# Patient Record
Sex: Female | Born: 1980 | Race: White | Hispanic: No | Marital: Married | Smoking: Former smoker
Health system: Southern US, Community
[De-identification: ages and names within clinical notes are randomized; demographics above are authoritative.]

## PROBLEM LIST (undated history)

## (undated) DIAGNOSIS — Z9889 Other specified postprocedural states: Secondary | ICD-10-CM

## (undated) DIAGNOSIS — K219 Gastro-esophageal reflux disease without esophagitis: Secondary | ICD-10-CM

## (undated) DIAGNOSIS — F419 Anxiety disorder, unspecified: Secondary | ICD-10-CM

## (undated) DIAGNOSIS — Z87442 Personal history of urinary calculi: Secondary | ICD-10-CM

## (undated) DIAGNOSIS — N289 Disorder of kidney and ureter, unspecified: Secondary | ICD-10-CM

## (undated) DIAGNOSIS — R112 Nausea with vomiting, unspecified: Secondary | ICD-10-CM

## (undated) DIAGNOSIS — O24419 Gestational diabetes mellitus in pregnancy, unspecified control: Secondary | ICD-10-CM

## (undated) HISTORY — PX: WISDOM TOOTH EXTRACTION: SHX21

## (undated) HISTORY — PX: DILATION AND CURETTAGE OF UTERUS: SHX78

## (undated) HISTORY — PX: LITHOTRIPSY: SUR834

---

## 2008-04-11 HISTORY — PX: DILATION AND CURETTAGE OF UTERUS: SHX78

## 2016-06-11 DIAGNOSIS — N760 Acute vaginitis: Secondary | ICD-10-CM | POA: Diagnosis not present

## 2016-12-07 DIAGNOSIS — M9901 Segmental and somatic dysfunction of cervical region: Secondary | ICD-10-CM | POA: Diagnosis not present

## 2016-12-07 DIAGNOSIS — M9902 Segmental and somatic dysfunction of thoracic region: Secondary | ICD-10-CM | POA: Diagnosis not present

## 2016-12-07 DIAGNOSIS — M542 Cervicalgia: Secondary | ICD-10-CM | POA: Diagnosis not present

## 2016-12-15 ENCOUNTER — Encounter (HOSPITAL_COMMUNITY): Payer: Self-pay | Admitting: Family Medicine

## 2016-12-15 ENCOUNTER — Emergency Department (HOSPITAL_COMMUNITY)
Admission: EM | Admit: 2016-12-15 | Discharge: 2016-12-15 | Disposition: A | Discharge status: Home / Self Care | Payer: 59 | Attending: Emergency Medicine | Admitting: Emergency Medicine

## 2016-12-15 DIAGNOSIS — F419 Anxiety disorder, unspecified: Secondary | ICD-10-CM | POA: Diagnosis not present

## 2016-12-15 DIAGNOSIS — F410 Panic disorder [episodic paroxysmal anxiety] without agoraphobia: Secondary | ICD-10-CM | POA: Diagnosis present

## 2016-12-15 DIAGNOSIS — Z87891 Personal history of nicotine dependence: Secondary | ICD-10-CM | POA: Insufficient documentation

## 2016-12-15 HISTORY — DX: Anxiety disorder, unspecified: F41.9

## 2016-12-15 HISTORY — DX: Disorder of kidney and ureter, unspecified: N28.9

## 2016-12-15 MED ORDER — HYDROXYZINE HCL 25 MG PO TABS: 50.0000 mg | ORAL_TABLET | Freq: Four times a day (QID) | ORAL | 0 refills | Status: AC | PRN

## 2016-12-15 NOTE — Discharge Instructions (Signed)
Please see the information and instructions below regarding your visit.  Your diagnoses today include:  1. Anxiety-like symptoms    We would like to get [facility]  Tests performed today include: See side panel of your discharge paperwork for testing performed today. Vital signs are listed at the bottom of these instructions.   Medications prescribed:   Your prescribed a medication called hydroxyzine. This medication helps with anxiety and can be taken up to 4 times a day. This medication can make you drowsy, so do not drive after first couple doses until you know how you feel. Take any prescribed medications only as prescribed, and any over the counter medications only as directed on the packaging.  Home care instructions:  Please follow any educational materials contained in this packet.   Follow-up instructions: Please follow-up with as soon as possible with your counselor or Behavioral Health listed in this packet for further assistance. The can also help get you back on your Cymbalta prescription.  Return instructions:  Please return for any worsening in your anxiety, frequency attacks, or any thoughts that you wanted to harm herself. Please return to the Emergency Department if you experience worsening symptoms.  Please return if you have any other emergent concerns.  Your vital signs today were: BP 140/81    Pulse 90    Temp 98.4 F (36.9 C)    Resp 15    Ht 5\' 2"  (1.575 m)    Wt 64.4 kg (142 lb)    LMP 12/05/2016    SpO2 99%    BMI 25.97 kg/m  If your blood pressure (BP) was elevated on multiple readings during this visit above 130 for the top number or above 80 for the bottom number, please have this repeated by your primary care provider within one month. --------------  Thank you for allowing us to participate in your care today.

## 2016-12-15 NOTE — ED Notes (Signed)
Bed: WTR5 Expected date:  Expected time:  Means of arrival:  Comments: 

## 2016-12-15 NOTE — ED Provider Notes (Signed)
[facility]-EMERGENCY DEPT Provider Note   CSN: [id number] Arrival date & time: 12/15/16  0550     History   Chief Complaint Chief Complaint  Patient presents with  . Panic Attack    HPI Felicia Tucker is a 36 y.o. female.  HPI  Patient is a 36 year old female with a history of anxiety and depression presenting for acute stress and panic symptoms following revealing to her spouse that she is having an affair. Patient reports that multiple times during this past week she has had symptoms of sudden dyspnea, lightheadedness, nausea and occasional emesis while thinking about revealing this information to her husband. Patient reports wishing to receive further resources in Behavioral Health to assist her through this situation. 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 Patient has had no physical threats from her spouse or others. Patient reports that she has a safe place to go at her brother's home. Patient denies any SI/HI, or any history of suicidal ideation or attempt. Patient had an ongoing relationship with a counselor, who she has not seen in a couple months. Patient was previously treated with Cymbalta, which she says works for her. Patient reports headaches, which she says are consistent with her baseline. Patient denies chest pain, dyspnea with exertion, abdominal pain, unilateral leg swelling, or other symptoms of DVT/PE. Patient has no cardiac or pulmonary history, and takes no other medications.    Past Medical History:  Diagnosis Date  . Anxiety   . Renal disorder    Kidney Stones    There are no active problems to display for this patient.   Past Surgical History:  Procedure Laterality Date  . DILATION AND CURETTAGE OF UTERUS    . LITHOTRIPSY    . WISDOM TOOTH EXTRACTION      OB History    No data available       Home Medications    Prior to  Admission medications   Medication Sig Start Date End Date Taking? Authorizing Provider  aspirin-acetaminophen-caffeine (EXCEDRIN MIGRAINE) [phone] MG tablet Take 2 tablets by mouth every 6 (six) hours as needed for headache.   Yes [provider]  ibuprofen (ADVIL,MOTRIN) 200 MG tablet Take 400 mg by mouth every 6 (six) hours as needed for headache.   Yes [provider]  hydrOXYzine (ATARAX/VISTARIL) 25 MG tablet Take 2 tablets (50 mg total) by mouth every 6 (six) hours as needed for anxiety. Take 1-2 pills up to 4 times daily. Take only 1 pill if you are getting too drowsy. 12/15/16 12/22/16  Elisha Ponder, PA-C    Family History History reviewed. No pertinent family history.  Social History Social History  Substance Use Topics  . Smoking status: Former Games developer  . Smokeless tobacco: Never Used  . Alcohol use No     Allergies   Patient has no known allergies.   Review of Systems Review of Systems  Constitutional: Negative for chills and fever.  HENT: Negative for congestion and rhinorrhea.   Eyes: Negative for visual disturbance.  Respiratory: Negative for shortness of breath.   Cardiovascular: Positive for palpitations. Negative for chest pain and leg swelling.  Gastrointestinal: Positive for nausea and vomiting. Negative for abdominal pain and diarrhea.  Genitourinary: Negative for dysuria.  Musculoskeletal: Negative for arthralgias and myalgias.  Neurological: Positive for light-headedness and headaches. Negative for dizziness and syncope.  Psychiatric/Behavioral: Negative for suicidal ideas.  Physical Exam Updated Vital Signs BP 136/80 (BP Location: Right Arm)   Pulse 89   Temp 98.4 F (36.9 C)   Resp 18   Ht  (1.575 m)   Wt 64.4 kg (142 lb)   LMP 12/05/2016   SpO2 100%   BMI 25.97 kg/m   Physical Exam  Constitutional: She appears well-developed and well-nourished. No distress.  Sitting comfortably in bed.  HENT:  Head:  Normocephalic and atraumatic.  Eyes: Conjunctivae are normal. Right eye exhibits no discharge. Left eye exhibits no discharge.  EOMs normal to gross examination.  Neck: Normal range of motion.  Cardiovascular: Normal rate, regular rhythm and normal heart sounds.   Intact, 2+ radial pulse.  Pulmonary/Chest: Effort normal and breath sounds normal.  Abdominal: She exhibits no distension.  Musculoskeletal: Normal range of motion.  Neurological: She is alert.  Cranial nerves intact to gross observation. Patient moves extremities with good coordination and without difficulty.  Skin: Skin is warm and dry. She is not diaphoretic.  Psychiatric: She has a normal mood and affect. Her behavior is normal. Judgment and thought content normal.  Patient tearful and mood appropriate to situation.  Nursing note and vitals reviewed.    ED Treatments / Results  Labs (all labs ordered are listed, but only abnormal results are displayed) Labs Reviewed - No data to display  EKG  EKG Interpretation None       Radiology No results found.  Procedures Procedures (including critical care time)  Medications Ordered in ED Medications - No data to display   Initial Impression / Assessment and Plan / ED Course  I have reviewed the triage vital signs and the nursing notes.  Pertinent labs & imaging results that were available during my care of the patient were reviewed by me and considered in my medical decision making (see chart for details).   Patient case discussed with preceptor Renne Crigler, PA-C. Plan of care and discharge management made in consultation.  Final Clinical Impressions(s) / ED Diagnoses   Final diagnoses:  Anxiety-like symptoms   MDM  Patient is a 36 year old female experiencing some situational anxiety surrounding the recent stressful life event of revealing an affair to her spouse. Patient is wishing to get resources for further management of Behavioral Health. Patient is  denying any suicidal ideations at this time and has no history of suicidal ideations or attempt. Patient contracts for safety, and reports that she has a safe home to go to at her brother's house. Doubt cardiopulmonary cause for patient's symptoms, as they are fleeting, and only occur in moments when patient is experiencing emotional stress surrounding this recent event. Patient is denying any physical harm or verbal harm/threats from anyone involved in the situation. Patient does not meet criteria for inpatient management at this time, and is deemed safe for discharge. Patient was given a prescription for 7 days Atarax, and resources to follow-up with [facility] outpatient services to establish care and for further medication management. Patient is in understanding, and agrees with the plan of care.   New Prescriptions Discharge Medication List as of 12/15/2016  8:24 AM    START taking these medications   Details  hydrOXYzine (ATARAX/VISTARIL) 25 MG tablet Take 2 tablets (50 mg total) by mouth every 6 (six) hours as needed for anxiety. Take 1-2 pills up to 4 times daily. Take only 1 pill if you are getting too drowsy., Starting Thu 12/15/2016, Until Thu 12/22/2016, Print  Elisha PonderMurray, Nashid Pellum B, PA-C 12/15/16 1653    Cathren LaineSteinl, Kevin, MD 12/19/16 1124

## 2016-12-15 NOTE — ED Triage Notes (Signed)
Patient reports is feeling anxious and this started this morning. Patient states she has been having an affair and yesterday it came out her partner/spouse. Denies SI and HI.

## 2017-02-15 ENCOUNTER — Encounter (HOSPITAL_COMMUNITY): Payer: Self-pay | Admitting: General Practice

## 2017-02-15 DIAGNOSIS — R102 Pelvic and perineal pain: Secondary | ICD-10-CM | POA: Diagnosis not present

## 2017-02-15 DIAGNOSIS — N200 Calculus of kidney: Secondary | ICD-10-CM | POA: Diagnosis not present

## 2017-02-15 DIAGNOSIS — R1012 Left upper quadrant pain: Secondary | ICD-10-CM | POA: Diagnosis not present

## 2017-02-15 DIAGNOSIS — R1032 Left lower quadrant pain: Secondary | ICD-10-CM | POA: Diagnosis not present

## 2017-02-16 ENCOUNTER — Ambulatory Visit (HOSPITAL_COMMUNITY)
Admission: RE | Admit: 2017-02-16 | Discharge: 2017-02-16 | Disposition: A | Discharge status: Home / Self Care | Payer: 59 | Source: Ambulatory Visit | Attending: Urology | Admitting: Urology

## 2017-02-16 ENCOUNTER — Encounter (HOSPITAL_COMMUNITY): Payer: Self-pay | Admitting: *Deleted

## 2017-02-16 ENCOUNTER — Encounter (HOSPITAL_COMMUNITY)
Admission: RE | Disposition: A | Discharge status: Home / Self Care | Payer: Self-pay | Source: Ambulatory Visit | Attending: Urology

## 2017-02-16 ENCOUNTER — Ambulatory Visit (HOSPITAL_COMMUNITY): Payer: 59

## 2017-02-16 ENCOUNTER — Other Ambulatory Visit: Payer: Self-pay

## 2017-02-16 DIAGNOSIS — N200 Calculus of kidney: Secondary | ICD-10-CM | POA: Diagnosis not present

## 2017-02-16 HISTORY — DX: Personal history of urinary calculi: Z87.442

## 2017-02-16 HISTORY — DX: Gestational diabetes mellitus in pregnancy, unspecified control: O24.419

## 2017-02-16 HISTORY — PX: EXTRACORPOREAL SHOCK WAVE LITHOTRIPSY: SHX1557

## 2017-02-16 LAB — PREGNANCY, URINE: Preg Test, Ur: NEGATIVE

## 2017-02-16 SURGERY — LITHOTRIPSY, ESWL
Anesthesia: LOCAL | Laterality: Left

## 2017-02-16 MED ORDER — HYDROCODONE-ACETAMINOPHEN 5-325 MG PO TABS: 1.0000 | ORAL_TABLET | ORAL | Status: DC | PRN

## 2017-02-16 MED ORDER — DIAZEPAM 5 MG PO TABS
10.0000 mg | ORAL_TABLET | Freq: Once | ORAL | Status: AC
  Administered 2017-02-16: 10 mg via ORAL
  Filled 2017-02-16: qty 2

## 2017-02-16 MED ORDER — DEXTROSE IN LACTATED RINGERS 5 % IV SOLN
INTRAVENOUS | Status: DC
  Administered 2017-02-16: 16:00:00 via INTRAVENOUS

## 2017-02-16 MED ORDER — ONDANSETRON HCL 4 MG/2ML IJ SOLN: 4.0000 mg | Freq: Once | INTRAMUSCULAR | Status: DC | PRN

## 2017-02-16 MED ORDER — LEVOFLOXACIN IN D5W 500 MG/100ML IV SOLN
500.0000 mg | Freq: Once | INTRAVENOUS | Status: AC
  Administered 2017-02-16: 500 mg via INTRAVENOUS
  Filled 2017-02-16: qty 100

## 2017-02-16 MED ORDER — DIPHENHYDRAMINE HCL 25 MG PO CAPS
25.0000 mg | ORAL_CAPSULE | Freq: Once | ORAL | Status: AC
  Administered 2017-02-16: 25 mg via ORAL
  Filled 2017-02-16: qty 1

## 2017-02-16 NOTE — Discharge Instructions (Signed)
Resume all your home medications. You have been given a prescription for levaquin, an antibiotic. Take until you have completed all the pills.   Lithotripsy, Care After This sheet gives you information about how to care for yourself after your procedure. Your health care provider may also give you more specific instructions. If you have problems or questions, contact your health care provider. What can I expect after the procedure? After the procedure, it is common to have:  Some blood in your urine. This should only last for a few days.  Soreness in your back, sides, or upper abdomen for a few days.  Blotches or bruises on your back where the pressure wave entered the skin.  Pain, discomfort, or nausea when pieces (fragments) of the kidney stone move through the tube that carries urine from the kidney to the bladder (ureter). Stone fragments may pass soon after the procedure, but they may continue to pass for up to 4-8 weeks. ? If you have severe pain or nausea, contact your health care provider. This may be caused by a large stone that was not broken up, and this may mean that you need more treatment.  Some pain or discomfort during urination.  Some pain or discomfort in the lower abdomen or (in men) at the base of the penis.  Follow these instructions at home: Medicines  Take over-the-counter and prescription medicines only as told by your health care provider.  If you were prescribed an antibiotic medicine, take it as told by your health care provider. Do not stop taking the antibiotic even if you start to feel better.  Do not drive for 24 hours if you were given a medicine to help you relax (sedative).  Do not drive or use heavy machinery while taking prescription pain medicine. Eating and drinking  Drink enough water and fluids to keep your urine clear or pale yellow. This helps any remaining pieces of the stone to pass. It can also help prevent new stones from forming.  Eat  plenty of fresh fruits and vegetables.  Follow instructions from your health care provider about eating and drinking restrictions. You may be instructed: ? To reduce how much salt (sodium) you eat or drink. Check ingredients and nutrition facts on packaged foods and beverages. ? To reduce how much meat you eat.  Eat the recommended amount of calcium for your age and gender. Ask your health care provider how much calcium you should have. General instructions  Get plenty of rest.  Most people can resume normal activities 1-2 days after the procedure. Ask your health care provider what activities are safe for you.  If directed, strain all urine through the strainer that was provided by your health care provider. ? Keep all fragments for your health care provider to see. Any stones that are found may be sent to a medical lab for examination. The stone may be as small as a grain of salt.  Keep all follow-up visits as told by your health care provider. This is important. Contact a health care provider if:  You have pain that is severe or does not get better with medicine.  You have nausea that is severe or does not go away.  You have blood in your urine longer than your health care provider told you to expect.  You have more blood in your urine.  You have pain during urination that does not go away.  You urinate more frequently than usual and this does not go away.  You develop a rash or any other possible signs of an allergic reaction. Get help right away if:  You have severe pain in your back, sides, or upper abdomen.  You have severe pain while urinating.  Your urine is very dark red.  You have blood in your stool (feces).  You cannot pass any urine at all.  You feel a strong urge to urinate after emptying your bladder.  You have a fever or chills.  You develop shortness of breath, difficulty breathing, or chest pain.  You have severe nausea that leads to persistent  vomiting.  You faint. Summary  After this procedure, it is common to have some pain, discomfort, or nausea when pieces (fragments) of the kidney stone move through the tube that carries urine from the kidney to the bladder (ureter). If this pain or nausea is severe, however, you should contact your health care provider.  Most people can resume normal activities 1-2 days after the procedure. Ask your health care provider what activities are safe for you.  Drink enough water and fluids to keep your urine clear or pale yellow. This helps any remaining pieces of the stone to pass, and it can help prevent new stones from forming.  If directed, strain your urine and keep all fragments for your health care provider to see. Fragments or stones may be as small as a grain of salt.  Get help right away if you have severe pain in your back, sides, or upper abdomen or have severe pain while urinating. This information is not intended to replace advice given to you by your health care provider. Make sure you discuss any questions you have with your health care provider. Document Released: 04/17/2007 Document Revised: 02/17/2016 Document Reviewed: 02/17/2016 Elsevier Interactive Patient Education  2017 Elsevier Inc[employer]    Moderate Conscious Sedation, Adult, Care After These instructions provide you with information about caring for yourself after your procedure. Your health care provider may also give you more specific instructions. Your treatment has been planned according to current medical practices, but problems sometimes occur. Call your health care provider if you have any problems or questions after your procedure. What can I expect after the procedure? After your procedure, it is common:  To feel sleepy for several hours.  To feel clumsy and have poor balance for several hours.  To have poor judgment for several hours.  To vomit if you eat too soon.  Follow these instructions at home: For at  least 24 hours after the procedure:   Do not: ? Participate in activities where you could fall or become injured. ? Drive. ? Use heavy machinery. ? Drink alcohol. ? Take sleeping pills or medicines that cause drowsiness. ? Make important decisions or sign legal documents. ? Take care of children on your own.  Rest. Eating and drinking  Follow the diet recommended by your health care provider.  If you vomit: ? Drink water, juice, or soup when you can drink without vomiting. ? Make sure you have little or no nausea before eating solid foods. General instructions  Have a responsible adult stay with you until you are awake and alert.  Take over-the-counter and prescription medicines only as told by your health care provider.  If you smoke, do not smoke without supervision.  Keep all follow-up visits as told by your health care provider. This is important. Contact a health care provider if:  You keep feeling nauseous or you keep vomiting.  You feel light-headed.  You develop a rash.  You have a fever. Get help right away if:  You have trouble breathing. This information is not intended to replace advice given to you by your health care provider. Make sure you discuss any questions you have with your health care provider. Document Released: 01/16/2013 Document Revised: 08/31/2015 Document Reviewed: 07/18/2015 Elsevier Interactive Patient Education  [employer].

## 2017-02-17 ENCOUNTER — Encounter (HOSPITAL_COMMUNITY): Payer: Self-pay | Admitting: Urology

## 2017-03-01 DIAGNOSIS — R1032 Left lower quadrant pain: Secondary | ICD-10-CM | POA: Diagnosis not present

## 2017-03-01 DIAGNOSIS — N200 Calculus of kidney: Secondary | ICD-10-CM | POA: Diagnosis not present

## 2017-03-22 DIAGNOSIS — N200 Calculus of kidney: Secondary | ICD-10-CM | POA: Diagnosis not present

## 2017-03-22 DIAGNOSIS — Z87891 Personal history of nicotine dependence: Secondary | ICD-10-CM | POA: Diagnosis not present

## 2017-03-24 DIAGNOSIS — N200 Calculus of kidney: Secondary | ICD-10-CM | POA: Diagnosis not present

## 2017-03-31 DIAGNOSIS — N200 Calculus of kidney: Secondary | ICD-10-CM | POA: Diagnosis not present

## 2017-03-31 DIAGNOSIS — K8020 Calculus of gallbladder without cholecystitis without obstruction: Secondary | ICD-10-CM | POA: Diagnosis not present

## 2017-04-10 DIAGNOSIS — G43009 Migraine without aura, not intractable, without status migrainosus: Secondary | ICD-10-CM | POA: Diagnosis not present

## 2017-04-19 DIAGNOSIS — R1012 Left upper quadrant pain: Secondary | ICD-10-CM | POA: Diagnosis not present

## 2017-04-19 DIAGNOSIS — N200 Calculus of kidney: Secondary | ICD-10-CM | POA: Diagnosis not present

## 2017-04-25 DIAGNOSIS — M9901 Segmental and somatic dysfunction of cervical region: Secondary | ICD-10-CM | POA: Diagnosis not present

## 2017-04-25 DIAGNOSIS — M5383 Other specified dorsopathies, cervicothoracic region: Secondary | ICD-10-CM | POA: Diagnosis not present

## 2017-04-25 DIAGNOSIS — M542 Cervicalgia: Secondary | ICD-10-CM | POA: Diagnosis not present

## 2017-04-27 DIAGNOSIS — M542 Cervicalgia: Secondary | ICD-10-CM | POA: Diagnosis not present

## 2017-04-27 DIAGNOSIS — M9901 Segmental and somatic dysfunction of cervical region: Secondary | ICD-10-CM | POA: Diagnosis not present

## 2017-04-27 DIAGNOSIS — M5383 Other specified dorsopathies, cervicothoracic region: Secondary | ICD-10-CM | POA: Diagnosis not present

## 2017-05-03 DIAGNOSIS — M9901 Segmental and somatic dysfunction of cervical region: Secondary | ICD-10-CM | POA: Diagnosis not present

## 2017-05-03 DIAGNOSIS — M542 Cervicalgia: Secondary | ICD-10-CM | POA: Diagnosis not present

## 2017-05-03 DIAGNOSIS — M5383 Other specified dorsopathies, cervicothoracic region: Secondary | ICD-10-CM | POA: Diagnosis not present

## 2017-05-04 DIAGNOSIS — M5383 Other specified dorsopathies, cervicothoracic region: Secondary | ICD-10-CM | POA: Diagnosis not present

## 2017-05-04 DIAGNOSIS — M542 Cervicalgia: Secondary | ICD-10-CM | POA: Diagnosis not present

## 2017-05-04 DIAGNOSIS — M9901 Segmental and somatic dysfunction of cervical region: Secondary | ICD-10-CM | POA: Diagnosis not present

## 2017-05-08 DIAGNOSIS — M542 Cervicalgia: Secondary | ICD-10-CM | POA: Diagnosis not present

## 2017-05-08 DIAGNOSIS — M9901 Segmental and somatic dysfunction of cervical region: Secondary | ICD-10-CM | POA: Diagnosis not present

## 2017-05-08 DIAGNOSIS — M5383 Other specified dorsopathies, cervicothoracic region: Secondary | ICD-10-CM | POA: Diagnosis not present

## 2017-05-10 DIAGNOSIS — M5383 Other specified dorsopathies, cervicothoracic region: Secondary | ICD-10-CM | POA: Diagnosis not present

## 2017-05-10 DIAGNOSIS — M9901 Segmental and somatic dysfunction of cervical region: Secondary | ICD-10-CM | POA: Diagnosis not present

## 2017-05-10 DIAGNOSIS — M542 Cervicalgia: Secondary | ICD-10-CM | POA: Diagnosis not present

## 2017-05-18 DIAGNOSIS — M542 Cervicalgia: Secondary | ICD-10-CM | POA: Diagnosis not present

## 2017-05-18 DIAGNOSIS — M5383 Other specified dorsopathies, cervicothoracic region: Secondary | ICD-10-CM | POA: Diagnosis not present

## 2017-05-18 DIAGNOSIS — M9901 Segmental and somatic dysfunction of cervical region: Secondary | ICD-10-CM | POA: Diagnosis not present

## 2017-05-23 DIAGNOSIS — M5383 Other specified dorsopathies, cervicothoracic region: Secondary | ICD-10-CM | POA: Diagnosis not present

## 2017-05-23 DIAGNOSIS — M9901 Segmental and somatic dysfunction of cervical region: Secondary | ICD-10-CM | POA: Diagnosis not present

## 2017-05-23 DIAGNOSIS — M542 Cervicalgia: Secondary | ICD-10-CM | POA: Diagnosis not present

## 2017-05-25 DIAGNOSIS — M9901 Segmental and somatic dysfunction of cervical region: Secondary | ICD-10-CM | POA: Diagnosis not present

## 2017-05-25 DIAGNOSIS — M542 Cervicalgia: Secondary | ICD-10-CM | POA: Diagnosis not present

## 2017-05-25 DIAGNOSIS — M5383 Other specified dorsopathies, cervicothoracic region: Secondary | ICD-10-CM | POA: Diagnosis not present

## 2021-07-20 ENCOUNTER — Ambulatory Visit
Admission: RE | Admit: 2021-07-20 | Discharge: 2021-07-20 | Disposition: A | Discharge status: Home / Self Care | Payer: Managed Care, Other (non HMO) | Source: Ambulatory Visit | Attending: Family Medicine | Admitting: Family Medicine

## 2021-07-20 ENCOUNTER — Other Ambulatory Visit: Payer: Self-pay | Admitting: Family Medicine

## 2021-07-20 DIAGNOSIS — N200 Calculus of kidney: Secondary | ICD-10-CM

## 2021-07-20 DIAGNOSIS — R1032 Left lower quadrant pain: Secondary | ICD-10-CM

## 2021-07-20 DIAGNOSIS — R109 Unspecified abdominal pain: Secondary | ICD-10-CM

## 2021-07-20 MED ORDER — IOPAMIDOL (ISOVUE-300) INJECTION 61%
100.0000 mL | Freq: Once | INTRAVENOUS | Status: AC | PRN
  Administered 2021-07-20: 100 mL via INTRAVENOUS

## 2021-07-26 ENCOUNTER — Encounter (HOSPITAL_COMMUNITY)
Admission: AD | Disposition: A | Discharge status: Home / Self Care | Payer: Self-pay | Source: Ambulatory Visit | Attending: Urology

## 2021-07-26 ENCOUNTER — Ambulatory Visit (HOSPITAL_COMMUNITY)
Admission: AD | Admit: 2021-07-26 | Discharge: 2021-07-26 | Disposition: A | Discharge status: Home / Self Care | Payer: Commercial Managed Care - HMO | Source: Ambulatory Visit | Attending: Urology | Admitting: Urology

## 2021-07-26 ENCOUNTER — Encounter (HOSPITAL_COMMUNITY): Payer: Self-pay | Admitting: Urology

## 2021-07-26 ENCOUNTER — Ambulatory Visit (HOSPITAL_COMMUNITY): Payer: Commercial Managed Care - HMO | Admitting: Certified Registered"

## 2021-07-26 ENCOUNTER — Ambulatory Visit (HOSPITAL_BASED_OUTPATIENT_CLINIC_OR_DEPARTMENT_OTHER): Payer: Commercial Managed Care - HMO | Admitting: Certified Registered"

## 2021-07-26 ENCOUNTER — Ambulatory Visit (HOSPITAL_COMMUNITY): Payer: Commercial Managed Care - HMO

## 2021-07-26 ENCOUNTER — Other Ambulatory Visit: Payer: Self-pay | Admitting: Urology

## 2021-07-26 DIAGNOSIS — N201 Calculus of ureter: Secondary | ICD-10-CM

## 2021-07-26 DIAGNOSIS — Z87442 Personal history of urinary calculi: Secondary | ICD-10-CM | POA: Diagnosis not present

## 2021-07-26 DIAGNOSIS — N202 Calculus of kidney with calculus of ureter: Secondary | ICD-10-CM | POA: Diagnosis not present

## 2021-07-26 DIAGNOSIS — N135 Crossing vessel and stricture of ureter without hydronephrosis: Secondary | ICD-10-CM | POA: Insufficient documentation

## 2021-07-26 DIAGNOSIS — N200 Calculus of kidney: Secondary | ICD-10-CM | POA: Diagnosis not present

## 2021-07-26 HISTORY — DX: Nausea with vomiting, unspecified: R11.2

## 2021-07-26 HISTORY — DX: Anxiety disorder, unspecified: F41.9

## 2021-07-26 HISTORY — DX: Nausea with vomiting, unspecified: Z98.890

## 2021-07-26 HISTORY — DX: Gastro-esophageal reflux disease without esophagitis: K21.9

## 2021-07-26 HISTORY — DX: Personal history of urinary calculi: Z87.442

## 2021-07-26 HISTORY — PX: CYSTOSCOPY/URETEROSCOPY/HOLMIUM LASER/STENT PLACEMENT: SHX6546

## 2021-07-26 HISTORY — DX: Other specified postprocedural states: Z98.890

## 2021-07-26 LAB — CBC
HCT: 39.8 % (ref 36.0–46.0)
Hemoglobin: 13.5 g/dL (ref 12.0–15.0)
MCH: 31.6 pg (ref 26.0–34.0)
MCHC: 33.9 g/dL (ref 30.0–36.0)
MCV: 93.2 fL (ref 80.0–100.0)
Platelets: 374 10*3/uL (ref 150–400)
RBC: 4.27 MIL/uL (ref 3.87–5.11)
RDW: 12.3 % (ref 11.5–15.5)
WBC: 9.1 10*3/uL (ref 4.0–10.5)
nRBC: 0 % (ref 0.0–0.2)

## 2021-07-26 LAB — BASIC METABOLIC PANEL
Anion gap: 9 (ref 5–15)
BUN: 12 mg/dL (ref 6–20)
CO2: 22 mmol/L (ref 22–32)
Calcium: 8.9 mg/dL (ref 8.9–10.3)
Chloride: 108 mmol/L (ref 98–111)
Creatinine, Ser: 0.69 mg/dL (ref 0.44–1.00)
GFR, Estimated: >60 mL/min (ref >60)
Glucose, Bld: 89 mg/dL (ref 70–99)
Potassium: 4 mmol/L (ref 3.5–5.1)
Sodium: 139 mmol/L (ref 135–145)

## 2021-07-26 LAB — PREGNANCY, URINE: Preg Test, Ur: NEGATIVE

## 2021-07-26 SURGERY — CYSTOSCOPY/URETEROSCOPY/HOLMIUM LASER/STENT PLACEMENT
Anesthesia: General | Laterality: Left

## 2021-07-26 MED ORDER — SCOPOLAMINE 1 MG/3DAYS TD PT72
1.0000 | MEDICATED_PATCH | TRANSDERMAL | Status: DC
  Administered 2021-07-26: 1.5 mg via TRANSDERMAL
  Filled 2021-07-26: qty 1

## 2021-07-26 MED ORDER — FENTANYL CITRATE PF 50 MCG/ML IJ SOSY
PREFILLED_SYRINGE | INTRAMUSCULAR | Status: AC
  Filled 2021-07-26: qty 2

## 2021-07-26 MED ORDER — LACTATED RINGERS IV SOLN
INTRAVENOUS | Status: DC
Start: 2021-07-26 — End: 2021-07-26

## 2021-07-26 MED ORDER — LIDOCAINE 2% (20 MG/ML) 5 ML SYRINGE
INTRAMUSCULAR | Status: DC | PRN
  Administered 2021-07-26: 70 mg via INTRAVENOUS

## 2021-07-26 MED ORDER — FENTANYL CITRATE (PF) 100 MCG/2ML IJ SOLN
INTRAMUSCULAR | Status: AC
  Filled 2021-07-26: qty 2

## 2021-07-26 MED ORDER — AMISULPRIDE (ANTIEMETIC) 5 MG/2ML IV SOLN: 10.0000 mg | Freq: Once | INTRAVENOUS | Status: DC | PRN

## 2021-07-26 MED ORDER — ONDANSETRON HCL 4 MG/2ML IJ SOLN
INTRAMUSCULAR | Status: DC | PRN
  Administered 2021-07-26: 4 mg via INTRAVENOUS

## 2021-07-26 MED ORDER — SODIUM CHLORIDE 0.9% FLUSH: 3.0000 mL | Freq: Two times a day (BID) | INTRAVENOUS | Status: DC

## 2021-07-26 MED ORDER — SODIUM CHLORIDE 0.9% FLUSH: 3.0000 mL | INTRAVENOUS | Status: DC | PRN

## 2021-07-26 MED ORDER — FENTANYL CITRATE PF 50 MCG/ML IJ SOSY
25.0000 ug | PREFILLED_SYRINGE | INTRAMUSCULAR | Status: DC | PRN
  Administered 2021-07-26 (×2): 50 ug via INTRAVENOUS

## 2021-07-26 MED ORDER — OXYCODONE HCL 5 MG PO TABS: 5.0000 mg | ORAL_TABLET | Freq: Once | ORAL | Status: DC | PRN

## 2021-07-26 MED ORDER — FENTANYL CITRATE (PF) 100 MCG/2ML IJ SOLN
INTRAMUSCULAR | Status: DC | PRN
  Administered 2021-07-26 (×4): 50 ug via INTRAVENOUS

## 2021-07-26 MED ORDER — MIDAZOLAM HCL 5 MG/5ML IJ SOLN
INTRAMUSCULAR | Status: DC | PRN
Start: 2021-07-26 — End: 2021-07-26
  Administered 2021-07-26: 2 mg via INTRAVENOUS

## 2021-07-26 MED ORDER — PROPOFOL 10 MG/ML IV BOLUS
INTRAVENOUS | Status: AC
  Filled 2021-07-26: qty 20

## 2021-07-26 MED ORDER — PHENAZOPYRIDINE HCL 200 MG PO TABS: 200.0000 mg | ORAL_TABLET | Freq: Three times a day (TID) | ORAL | 0 refills | Status: DC | PRN

## 2021-07-26 MED ORDER — IOHEXOL 300 MG/ML  SOLN
INTRAMUSCULAR | Status: DC | PRN
  Administered 2021-07-26: 7 mL via URETHRAL

## 2021-07-26 MED ORDER — SODIUM CHLORIDE 0.9 % IV SOLN: 250.0000 mL | INTRAVENOUS | Status: DC | PRN

## 2021-07-26 MED ORDER — PROPOFOL 10 MG/ML IV BOLUS
INTRAVENOUS | Status: DC | PRN
  Administered 2021-07-26: 200 mg via INTRAVENOUS

## 2021-07-26 MED ORDER — DEXAMETHASONE SODIUM PHOSPHATE 10 MG/ML IJ SOLN
INTRAMUSCULAR | Status: DC | PRN
  Administered 2021-07-26: 10 mg via INTRAVENOUS

## 2021-07-26 MED ORDER — FENTANYL CITRATE PF 50 MCG/ML IJ SOSY
PREFILLED_SYRINGE | INTRAMUSCULAR | Status: AC
  Filled 2021-07-26: qty 1

## 2021-07-26 MED ORDER — SODIUM CHLORIDE 0.9 % IR SOLN
Status: DC | PRN
  Administered 2021-07-26: 3000 mL via INTRAVESICAL

## 2021-07-26 MED ORDER — CEFAZOLIN SODIUM-DEXTROSE 2-4 GM/100ML-% IV SOLN
2.0000 g | INTRAVENOUS | Status: AC
  Administered 2021-07-26: 2 g via INTRAVENOUS
  Filled 2021-07-26: qty 100

## 2021-07-26 MED ORDER — ACETAMINOPHEN 500 MG PO TABS
1000.0000 mg | ORAL_TABLET | Freq: Once | ORAL | Status: AC
  Administered 2021-07-26: 1000 mg via ORAL
  Filled 2021-07-26: qty 2

## 2021-07-26 MED ORDER — CHLORHEXIDINE GLUCONATE 0.12 % MT SOLN
15.0000 mL | OROMUCOSAL | Status: AC
  Administered 2021-07-26: 15 mL via OROMUCOSAL

## 2021-07-26 MED ORDER — OXYCODONE HCL 5 MG/5ML PO SOLN: 5.0000 mg | Freq: Once | ORAL | Status: DC | PRN

## 2021-07-26 MED ORDER — MIDAZOLAM HCL 2 MG/2ML IJ SOLN
INTRAMUSCULAR | Status: AC
  Filled 2021-07-26: qty 2

## 2021-07-26 MED ORDER — ONDANSETRON HCL 4 MG/2ML IJ SOLN: 4.0000 mg | Freq: Once | INTRAMUSCULAR | Status: DC | PRN

## 2021-07-26 SURGICAL SUPPLY — 26 items
BAG URO CATCHER STRL LF (MISCELLANEOUS) ×2 IMPLANT
BASKET STONE NCOMPASS (UROLOGICAL SUPPLIES) IMPLANT
CATH URETERAL DUAL LUMEN 10F (MISCELLANEOUS) IMPLANT
CATH URETL OPEN 5X70 (CATHETERS) ×1 IMPLANT
CLOTH BEACON ORANGE TIMEOUT ST (SAFETY) ×2 IMPLANT
EXTRACTOR STONE NITINOL NGAGE (UROLOGICAL SUPPLIES) ×1 IMPLANT
GLOVE SURG SS PI 8.0 STRL IVOR (GLOVE) ×2 IMPLANT
GOWN STRL REUS W/ TWL LRG LVL3 (GOWN DISPOSABLE) IMPLANT
GOWN STRL REUS W/ TWL XL LVL3 (GOWN DISPOSABLE) ×1 IMPLANT
GOWN STRL REUS W/TWL LRG LVL3 (GOWN DISPOSABLE) ×2
GOWN STRL REUS W/TWL XL LVL3 (GOWN DISPOSABLE) ×2
GUIDEWIRE STR DUAL SENSOR (WIRE) ×2 IMPLANT
IV NS IRRIG 3000ML ARTHROMATIC (IV SOLUTION) ×2 IMPLANT
KIT TURNOVER KIT A (KITS) IMPLANT
LASER FIB FLEXIVA PULSE ID 365 (Laser) IMPLANT
LASER FIB FLEXIVA PULSE ID 550 (Laser) IMPLANT
LASER FIB FLEXIVA PULSE ID 910 (Laser) IMPLANT
MANIFOLD NEPTUNE II (INSTRUMENTS) ×2 IMPLANT
PACK CYSTO (CUSTOM PROCEDURE TRAY) ×2 IMPLANT
SHEATH NAVIGATOR HD 11/13X36 (SHEATH) ×1 IMPLANT
STENT URET 6FRX22 CONTOUR (STENTS) IMPLANT
STENT URET 6FRX24 CONTOUR (STENTS) ×1 IMPLANT
TRACTIP FLEXIVA PULS ID 200XHI (Laser) IMPLANT
TRACTIP FLEXIVA PULSE ID 200 (Laser) ×2
TUBING CONNECTING 10 (TUBING) ×2 IMPLANT
TUBING UROLOGY SET (TUBING) ×2 IMPLANT

## 2021-07-26 NOTE — H&P (Signed)
I have ureteral stone.  ?HPI: Felicia Tucker is a 41 year-old female patient who is here for ureteral stone. ? ? ? ?07/26/21: Felicia Tucker is a 41 yo female with a long history of stones and Mx lithotripsies. She has not had ureteroscopy. She had left flank pain last week that was severe with N/V and a CT on 4/11 showed an 87mm LUPJ stone and a smaller mid ureteral stone with obstruction. She also had small bilateral stones. Her stones are calcium oxalate. She is feeling ok today. She had some milder symptoms for about 6 weeks prior.  ? ? ?  ?ALLERGIES:  ?  ? ?MEDICATIONS: Omeprazole  ?Tamsulosin Hcl 0.4 mg capsule  ?Diphenhydramine Hcl 25 mg tablet  ?Hair, Skin And Nails  ?Klonopin 0.5 mg tablet  ?Oxycodone Hcl  ?Vitamin D3  ?  ? ?GU PSH: None  ?   ?PSH Notes: Lithotrispy- 2008  ? ?NON-GU PSH: D & C After Delivery ? ?  ? ?GU PMH: None  ? ?NON-GU PMH: Anxiety ?Diabetes Type 2 ?GERD ?  ? ?FAMILY HISTORY: 2 daughters - Other  ? ?SOCIAL HISTORY: Marital Status: Married ?Preferred Language: [place]; Ethnicity: Not Hispanic Or Latino; Race: White ?Current Smoking Status: Patient does not smoke anymore. Has not smoked since 07/10/1996. Smoked for 2 years. Smoked 1 pack per day.  ? ?Tobacco Use Assessment Completed: Used Tobacco in last 30 days? ?Does not use smokeless tobacco. ?Has never drank.  ?Does not use drugs. ?Drinks 2 caffeinated drinks per day. ?  ? ?REVIEW OF SYSTEMS:    ?GU Review Female:   Patient reports frequent urination. Patient denies hard to postpone urination, burning /pain with urination, get up at night to urinate, leakage of urine, stream starts and stops, trouble starting your stream, have to strain to urinate, and being pregnant.  ?Gastrointestinal (Upper):   Patient reports nausea and indigestion/ heartburn. Patient denies vomiting.  ?Gastrointestinal (Lower):   Patient denies diarrhea and constipation.  ?Constitutional:   Patient denies fever, night sweats, weight loss, and fatigue.  ?Skin:   Patient  denies skin rash/ lesion and itching.  ?Eyes:   Patient denies blurred vision and double vision.  ?Ears/ Nose/ Throat:   Patient denies sore throat and sinus problems.  ?Hematologic/Lymphatic:   Patient denies swollen glands and easy bruising.  ?Cardiovascular:   Patient denies leg swelling and chest pains.  ?Respiratory:   Patient denies cough and shortness of breath.  ?Endocrine:   Patient denies excessive thirst.  ?Musculoskeletal:   Patient denies back pain and joint pain.  ?Neurological:   Patient denies headaches and dizziness.  ?Psychologic:   Patient denies depression and anxiety.  ? ?VITAL SIGNS:    ?  07/26/2021 08:28 AM  ?Weight 158 lb / 71.67 kg  ?Height 63 in / 160.02 cm  ?BP 126/85 mmHg  ?Heart Rate 91 /min  ?Temperature 98.2 F / 36.7 C  ?BMI 28.0 kg/m?  ? ?MULTI-SYSTEM PHYSICAL EXAMINATION:    ?Constitutional: Well-nourished. No physical deformities. Normally developed. Good grooming.   ?Respiratory: No labored breathing, no use of accessory muscles.   ?Cardiovascular: Regular rate and rhythm. No murmur, no gallop.   ?Gastrointestinal: No mass, no tenderness, no rigidity, non obese abdomen.   ? ?  ?Complexity of Data:  ?Records Review:   Previous Doctor Records  ?Urine Test Review:   Urinalysis  ?X-Ray Review: [facility]: Reviewed Films. Reviewed Report. Discussed With Patient.  ?C.T. Abdomen/Pelvis: Reviewed Films. Reviewed Report. Discussed With Patient. Mx bilateral stones with  left hydro and an 13mm UPJ stone and a 4 mm mid stone. Large gall stone.  ?  ? ?PROCEDURES:    ?     KUB - [id number]  ?A single view of the abdomen is obtained. There is a possiible 61mm stone in the lower left proximal ureter and an 4mm LUPJ stone that is unchanged from the recent CT. There are several small renal stones with the largest in the LUP. She has no bone, gas or soft tissue abnormalities.   ?  ?  ?Patient confirmed No Neulasta OnPro Device. ? ? ?  ? ? ?     Urinalysis ?Dipstick Dipstick Cont'd  ?Color: Yellow Bilirubin: Neg  mg/dL  ?Appearance: Clear Ketones: Neg mg/dL  ?Specific Gravity: 1.020 Blood: Neg ery/uL  ?pH: 6.0 Protein: Trace mg/dL  ?Glucose: Neg mg/dL Urobilinogen: 0.2 mg/dL  ?  Nitrites: Neg  ?  Leukocyte Esterase: Neg leu/uL  ? ? ?ASSESSMENT:  ?    ICD-10 Details  ?1 GU:   Renal and ureteral calculus - N20.2 Acute, Systemic Symptoms - She has a large LUPJ stone with a possible smaller mid to lower proximal ureteral stone and bilateral renal stones. I have reviewed options and will proceed with urgency ureteroscopy this evening.  ? ?I have reviewed the risks of ureteroscopy including bleeding, infection, ureteral injury, need for a stent or secondary procedures, thrombotic events and anesthetic complications.  ?  ?2   Ureteral obstruction secondary to calculous - N13.2 Acute, Systemic Symptoms  ? ?PLAN:    ? ?      Orders ?X-Rays: [facility]  ? ? ?      Schedule ?Return Visit/Planned Activity: ASAP - Schedule Surgery  ?Procedure: 07/26/2021 - Cysto Uretero Lithotripsy - [phone], left  ? ? ?      Document ?Letter(s):  Created for Patient: Clinical Summary  ? ? ?     Notes:   CC: [place] in .   ? ?     Next Appointment:    ?  Next Appointment: 08/04/2021 02:45 PM  ?  Appointment Type: Postoperative Appointment  ?  Location: [facility] [phone]  ?  Provider: Bartholomew Crews, NP  ?  Reason for Visit: POST OP  ?  ? ? ?

## 2021-07-26 NOTE — Discharge Instructions (Addendum)
You may remove the stent by pulling the attached string on Thursday morning.  The string is tucked vaginally.  If you don't feel you can do that, please call the office to arrange a visit to have it removed. ? ?Please bring your stone fragments to your f/u appoint for stone analysis.  ?

## 2021-07-26 NOTE — Op Note (Signed)
Procedure: 1.  Cystoscopy with left retrograde pyelogram and interpretation. ?2.  Left ureteroscopy with holmium laser application, stone extraction and insertion of left double-J stent. ?3.  Application of fluoroscopy. ? ?Preop diagnosis: Left UPJ and renal stones with possible left proximal stone. ? ?Postop diagnosis: Left UPJ and renal stones.  No ureteral stone found. ? ?Surgeon: Dr. Bjorn Pippin. ? ?Anesthesia: General. ? ?Specimen: Stone fragments. ? ?Drains: 6 French by 24 cm left contour double-J stent with tether. ? ?EBL: None. ? ?Complications: None. ? ?Indications: The patient is a 41 year old female with a long history of recurrent urolithiasis treated with multiple lithotripsies in the past.  She was recently found to have an 8 mm stone at the left UPJ along with smaller renal stones and a possible lower proximal ureteral stone.  Initial plan by her urologist in [place] was to treat the larger stone with lithotripsy but her insurance plan did not cover that procedure.  She is elected to undergo ureteroscopy today. ? ?Procedure: She was taken operating room where she was given antibiotic.  A general anesthetic was induced.  She was placed in lithotomy position and fitted with PAS hose.  Her perineum and genitalia were prepped with Betadine solution and she was draped in usual sterile fashion. ? ?Cystoscopy was performed using the 21 [place] scope and 30 degree lens.  Examination revealed a normal urethra.  The bladder wall was smooth and pale without tumors, stones or inflammation.  Ureteral orifices were unremarkable. ? ?The left ureteral orifice was cannulated with 5 [place] open-ended catheter and contrast was instilled. ? ?The left retrograde pyelogram demonstrated a normal caliber ureter without filling defect to the renal pelvis where there was a filling defect consistent with the stone that had been at the UPJ but now appears to be free-floating in the renal pelvis.  An additional smaller filling  defect was noted in the upper pole from her known stone in that area. ? ?A sensor wire was advanced to the kidney through the open-ended catheter and the open-ended catheter and cystoscope were removed.  A 36 cm 11 French digital access inner core was passed easily over the wire to the renal pelvis.  This was followed by the assembled 11/13 [place] sheath.  Once the sheath was in the proximal ureter, the inner core and wire were removed.  The dual-lumen digital flexible scope was then advanced through the sheath to the kidney and the renal pelvic stone was identified but I advanced the scope into the upper calyx where the other larger stone was noted and elected to treat it first.  The 200 ?m tract tip laser was then passed through the scope with the [facility] laser set on the dusting setting.  The upper pole stone was then broken into manageable fragments.  I then turned my attention to the larger renal pelvic stone which measured approximately 8 mm and broken into manageable fragments.  Inspection of other calyces demonstrated primarily Randall's plaques without retrievable stone material.  The engage basket was then used to retrieve all significant stone fragments.  Final fluoroscopic and visual inspection revealed no significant residual stones.  A sensor wire was then advanced through the scope to the kidney and the scope was backed out under direct vision.  Once again no ureteral calculi were identified. ? ?The cystoscope was then reinserted over the wire and a 6 [place] by 24 cm contour double-J stent was passed without difficulty to the kidney.  The wire was removed, leaving a  good coil in the kidney and a good coil in the bladder.  The cystoscope was removed after the bladder was drained and the stent string was left exiting the urethra.  The string was knotted close to the urethral meatus and trimmed to an appropriate length before being tucked vaginally and a tampon string fashion.  She was taken down from  lithotomy position, her anesthetic was reversed and she was moved to recovery room in stable condition.  There were no complications.  The stone fragments were given to her husband to bring to the office at follow-up. ?

## 2021-07-26 NOTE — Transfer of Care (Signed)
Immediate Anesthesia Transfer of Care Note ? ?Patient: Felicia Tucker ? ?Procedure(s) Performed: CYSTOSCOPY/URETEROSCOPY/HOLMIUM LASER/STENT PLACEMENT (Left) ? ?Patient Location: PACU ? ?Anesthesia Type:General ? ?Level of Consciousness: awake, alert , oriented and patient cooperative ? ?Airway & Oxygen Therapy: Patient Spontanous Breathing and Patient connected to nasal cannula oxygen ? ?Post-op Assessment: Report given to RN and Post -op Vital signs reviewed and stable ? ?Post vital signs: Reviewed and stable ? ?Last Vitals:  ?Vitals Value Taken Time  ?BP 130/78 07/26/21 1739  ?Temp    ?Pulse 109 07/26/21 1741  ?Resp 16 07/26/21 1741  ?SpO2 97 % 07/26/21 1741  ?Vitals shown include unvalidated device data. ? ?Last Pain:  ?Vitals:  ? 07/26/21 1550  ?TempSrc: Oral  ?PainSc: 0-No pain  ?   ? ?  ? ?Complications: No notable events documented. ?

## 2021-07-26 NOTE — Anesthesia Procedure Notes (Signed)
Procedure Name: LMA Insertion ?Date/Time: 07/26/2021 4:38 PM ?Performed by: Cleda Daub, CRNA ?Pre-anesthesia Checklist: Patient identified, Emergency Drugs available, Suction available and Patient being monitored ?Patient Re-evaluated:Patient Re-evaluated prior to induction ?Oxygen Delivery Method: Circle system utilized ?Preoxygenation: Pre-oxygenation with 100% oxygen ?Induction Type: IV induction ?LMA: LMA inserted ?LMA Size: 4.0 ?Number of attempts: 1 ?Placement Confirmation: positive ETCO2 and breath sounds checked- equal and bilateral ?Tube secured with: Tape ?Dental Injury: Teeth and Oropharynx as per pre-operative assessment  ? ? ? ? ?

## 2021-07-26 NOTE — Anesthesia Preprocedure Evaluation (Addendum)
Anesthesia Evaluation  ?Patient identified by MRN, date of birth, ID band ?Patient awake ? ? ? ?Reviewed: ?Allergy & Precautions, NPO status , Patient's Chart, lab work & pertinent test results ? ?History of Anesthesia Complications ?(+) PONV ? ?Airway ?Mallampati: II ? ?TM Distance: >3 FB ?Neck ROM: Full ? ? ? Dental ?no notable dental hx. ? ?  ?Pulmonary ?neg pulmonary ROS,  ?  ?Pulmonary exam normal ?breath sounds clear to auscultation ? ? ? ? ? ? Cardiovascular ?negative cardio ROS ?Normal cardiovascular exam ?Rhythm:Regular Rate:Normal ? ? ?  ?Neuro/Psych ?PSYCHIATRIC DISORDERS Anxiety negative neurological ROS ?   ? GI/Hepatic ?Neg liver ROS, GERD  Controlled and Medicated,  ?Endo/Other  ?negative endocrine ROS ? Renal/GU ?Renal disease (kidney stones)  ?negative genitourinary ?  ?Musculoskeletal ?negative musculoskeletal ROS ?(+)  ? Abdominal ?  ?Peds ?negative pediatric ROS ?(+)  Hematology ?negative hematology ROS ?(+)   ?Anesthesia Other Findings ? ? Reproductive/Obstetrics ?negative OB ROS ? ?  ? ? ? ? ? ? ? ? ? ? ? ? ? ?  ?  ? ? ? ? ? ? ?Anesthesia Physical ?Anesthesia Plan ? ?ASA: 2 and emergent ? ?Anesthesia Plan: General  ? ?Post-op Pain Management: Tylenol PO (pre-op)*  ? ?Induction: Intravenous ? ?PONV Risk Score and Plan: 3 and Treatment may vary due to age or medical condition, Midazolam, Ondansetron, Dexamethasone and Scopolamine patch - Pre-op ? ?Airway Management Planned: LMA ? ?Additional Equipment:  ? ?Intra-op Plan:  ? ?Post-operative Plan: Extubation in OR ? ?Informed Consent: I have reviewed the patients History and Physical, chart, labs and discussed the procedure including the risks, benefits and alternatives for the proposed anesthesia with the patient or authorized representative who has indicated his/her understanding and acceptance.  ? ? ? ?Dental advisory given ? ?Plan Discussed with: CRNA, Anesthesiologist and Surgeon ? ?Anesthesia Plan Comments:    ? ? ? ? ? ?Anesthesia Quick Evaluation ? ?

## 2021-07-26 NOTE — Interval H&P Note (Signed)
History and Physical Interval Note: ? ?07/26/2021 ?4:11 PM ? ?Felicia Tucker  has presented today for surgery, with the diagnosis of LEFT URETERAL AND RENAL STONE.  The various methods of treatment have been discussed with the patient and family. After consideration of risks, benefits and other options for treatment, the patient has consented to  Procedure(s): ?CYSTOSCOPY/URETEROSCOPY/HOLMIUM LASER/STENT PLACEMENT (Left) as a surgical intervention.  The patient's history has been reviewed, patient examined, no change in status, stable for surgery.  I have reviewed the patient's chart and labs.  Questions were answered to the patient's satisfaction.   ? ? ?Bjorn Pippin ? ? ?

## 2021-07-27 ENCOUNTER — Encounter (HOSPITAL_COMMUNITY): Payer: Self-pay | Admitting: Urology

## 2021-07-28 ENCOUNTER — Encounter: Payer: Self-pay | Admitting: Gastroenterology

## 2021-07-28 NOTE — Anesthesia Postprocedure Evaluation (Signed)
Anesthesia Post Note ? ?Patient: Felicia Tucker ? ?Procedure(s) Performed: CYSTOSCOPY/URETEROSCOPY/HOLMIUM LASER/STENT PLACEMENT (Left) ? ?  ? ?Patient location during evaluation: PACU ?Anesthesia Type: General ?Level of consciousness: awake ?Pain management: pain level controlled ?Vital Signs Assessment: post-procedure vital signs reviewed and stable ?Respiratory status: spontaneous breathing and respiratory function stable ?Cardiovascular status: stable ?Postop Assessment: no apparent nausea or vomiting ?Anesthetic complications: no ? ? ?No notable events documented. ? ?Last Vitals:  ?Vitals:  ? 07/26/21 1800 07/26/21 1810  ?BP: 125/72 124/68  ?Pulse: (!) 102 98  ?Resp: 15 17  ?Temp:  36.4 ?C  ?SpO2: 94% 96%  ?  ?Last Pain:  ?Vitals:  ? 07/26/21 1810  ?TempSrc:   ?PainSc: 5   ? ? ?  ?  ?  ?  ?  ?  ? ?Merlinda Frederick ? ? ? ? ?

## 2021-07-29 ENCOUNTER — Emergency Department (HOSPITAL_COMMUNITY): Payer: Commercial Managed Care - HMO

## 2021-07-29 ENCOUNTER — Emergency Department (HOSPITAL_COMMUNITY)
Admission: EM | Admit: 2021-07-29 | Discharge: 2021-07-30 | Disposition: A | Discharge status: Home / Self Care | Payer: Commercial Managed Care - HMO | Attending: Emergency Medicine | Admitting: Emergency Medicine

## 2021-07-29 ENCOUNTER — Encounter (HOSPITAL_COMMUNITY): Payer: Self-pay

## 2021-07-29 ENCOUNTER — Other Ambulatory Visit: Payer: Self-pay

## 2021-07-29 DIAGNOSIS — D72829 Elevated white blood cell count, unspecified: Secondary | ICD-10-CM | POA: Insufficient documentation

## 2021-07-29 DIAGNOSIS — R109 Unspecified abdominal pain: Secondary | ICD-10-CM | POA: Diagnosis present

## 2021-07-29 DIAGNOSIS — R110 Nausea: Secondary | ICD-10-CM | POA: Insufficient documentation

## 2021-07-29 DIAGNOSIS — Z87442 Personal history of urinary calculi: Secondary | ICD-10-CM | POA: Diagnosis not present

## 2021-07-29 LAB — CBC WITH DIFFERENTIAL/PLATELET
Abs Immature Granulocytes: 0.03 10*3/uL (ref 0.00–0.07)
Basophils Absolute: 0 10*3/uL (ref 0.0–0.1)
Basophils Relative: 0 %
Eosinophils Absolute: 0.1 10*3/uL (ref 0.0–0.5)
Eosinophils Relative: 1 %
HCT: 38.2 % (ref 36.0–46.0)
Hemoglobin: 13 g/dL (ref 12.0–15.0)
Immature Granulocytes: 0 %
Lymphocytes Relative: 23 %
Lymphs Abs: 2.7 10*3/uL (ref 0.7–4.0)
MCH: 31.3 pg (ref 26.0–34.0)
MCHC: 34 g/dL (ref 30.0–36.0)
MCV: 91.8 fL (ref 80.0–100.0)
Monocytes Absolute: 1 10*3/uL (ref 0.1–1.0)
Monocytes Relative: 9 %
Neutro Abs: 8 10*3/uL — ABNORMAL HIGH (ref 1.7–7.7)
Neutrophils Relative %: 67 %
Platelets: 423 10*3/uL — ABNORMAL HIGH (ref 150–400)
RBC: 4.16 MIL/uL (ref 3.87–5.11)
RDW: 12.1 % (ref 11.5–15.5)
WBC: 11.9 10*3/uL — ABNORMAL HIGH (ref 4.0–10.5)
nRBC: 0 % (ref 0.0–0.2)

## 2021-07-29 LAB — URINALYSIS, ROUTINE W REFLEX MICROSCOPIC
Bilirubin Urine: NEGATIVE
Glucose, UA: NEGATIVE mg/dL
Ketones, ur: NEGATIVE mg/dL
Nitrite: POSITIVE — AB
Protein, ur: NEGATIVE mg/dL
Specific Gravity, Urine: 1.002 — ABNORMAL LOW (ref 1.005–1.030)
pH: 7 (ref 5.0–8.0)

## 2021-07-29 LAB — COMPREHENSIVE METABOLIC PANEL
ALT: 19 U/L (ref 0–44)
AST: 15 U/L (ref 15–41)
Albumin: 3.9 g/dL (ref 3.5–5.0)
Alkaline Phosphatase: 41 U/L (ref 38–126)
Anion gap: 8 (ref 5–15)
BUN: 11 mg/dL (ref 6–20)
CO2: 25 mmol/L (ref 22–32)
Calcium: 9.8 mg/dL (ref 8.9–10.3)
Chloride: 106 mmol/L (ref 98–111)
Creatinine, Ser: 0.75 mg/dL (ref 0.44–1.00)
GFR, Estimated: >60 mL/min (ref >60)
Glucose, Bld: 112 mg/dL — ABNORMAL HIGH (ref 70–99)
Potassium: 4.6 mmol/L (ref 3.5–5.1)
Sodium: 139 mmol/L (ref 135–145)
Total Bilirubin: 0.6 mg/dL (ref 0.3–1.2)
Total Protein: 6.6 g/dL (ref 6.5–8.1)

## 2021-07-29 LAB — I-STAT BETA HCG BLOOD, ED (MC, WL, AP ONLY): I-stat hCG, quantitative: <5 m[IU]/mL (ref <5)

## 2021-07-29 MED ORDER — CEFTRIAXONE SODIUM 1 G IJ SOLR
1.0000 g | Freq: Once | INTRAMUSCULAR | Status: AC
  Administered 2021-07-30: 1 g via INTRAVENOUS
  Filled 2021-07-29: qty 10

## 2021-07-29 MED ORDER — FENTANYL CITRATE PF 50 MCG/ML IJ SOSY
50.0000 ug | PREFILLED_SYRINGE | Freq: Once | INTRAMUSCULAR | Status: AC
  Administered 2021-07-30: 50 ug via INTRAVENOUS
  Filled 2021-07-29: qty 1

## 2021-07-29 MED ORDER — SODIUM CHLORIDE 0.9 % IV SOLN
1.5000 mg/kg | Freq: Once | INTRAVENOUS | Status: AC
  Administered 2021-07-30: 108 mg via INTRAVENOUS
  Filled 2021-07-29: qty 5.4

## 2021-07-29 NOTE — ED Provider Triage Note (Signed)
Emergency Medicine Provider Triage Evaluation Note ? ?Felicia Tucker , a 41 y.o. female  was evaluated in triage.  Pt complains of left flank and abdominal pain.  Patient states that she was recently diagnosed with a left-sided kidney stone and had a ureteral stent placed.  She removed the stent yesterday and afterwards her pain returned.  States it has been intermittent.  Denies any relief with oxycodone. ? ?Physical Exam  ?BP (!) 133/94 (BP Location: Right Arm)   Pulse (!) 107   Temp 98 ?F (36.7 ?C) (Oral)   Resp 18   LMP 07/20/2021 (Exact Date)   SpO2 94%  ?Gen:   Awake, no distress   ?Resp:  Normal effort  ?MSK:   Moves extremities without difficulty  ?Other:   ? ?Medical Decision Making  ?Medically screening exam initiated at 8:00 PM.  Appropriate orders placed.  Maridee Slape was informed that the remainder of the evaluation will be completed by another provider, this initial triage assessment does not replace that evaluation, and the importance of remaining in the ED until their evaluation is complete. ?  ?Placido Sou, PA-C ?07/29/21 2000 ? ?

## 2021-07-29 NOTE — ED Triage Notes (Signed)
Pt reports with left flank pain since having kidney stone surgery on Monday. Pt reports taking pain medication with no relief.  ?

## 2021-07-29 NOTE — ED Provider Notes (Signed)
?[facility]-EMERGENCY DEPT ?Provider Note ? ? ?CSN: [id number] ?Arrival date & time: 07/29/21  1847 ? ?  ? ?History ? ?Chief Complaint  ?Patient presents with  ? Flank Pain  ? ? ?Felicia Tucker is a 41 y.o. female. ? ?The history is provided by the patient and medical records.  ?Flank Pain ? ?41 y.o. F with history of recurrent kidney stones, presenting to the ED with left flank pain.  Patient had left-sided stone with ureteral stent placement on 07/26/2021 with Dr. Annabell HowellsWrenn.  She pulled her stent out last night as instructed and almost immediately after began having pain and was up most of the night with this.  She has been having discolored urine but is on pyridium so unsure if any true hematuria.  Reports nausea but denies vomiting.  No fever/chills.  States she has been taking oxycodone without much relief at home.   ? ?Home Medications ?Prior to Admission medications   ?Medication Sig Start Date End Date Taking? Authorizing Provider  ?cephALEXin (KEFLEX) 500 MG capsule Take 1 capsule (500 mg total) by mouth 3 (three) times daily. 07/30/21  Yes Garlon HatchetSanders, Zunairah Devers M, PA-C  ?aspirin-acetaminophen-caffeine (EXCEDRIN MIGRAINE) [phone] MG tablet Take 2 tablets by mouth every 6 (six) hours as needed for headache.    [provider]  ?clonazePAM (KLONOPIN) 0.5 MG tablet Take 0.5 mg by mouth as needed for anxiety.    [provider]  ?ibuprofen (ADVIL,MOTRIN) 200 MG tablet Take 400 mg by mouth every 6 (six) hours as needed for headache.    [provider]  ?Multiple Vitamins-Minerals (HAIR/SKIN/NAILS) CAPS Take 2 tablets by mouth daily.    [provider]  ?Omeprazole-Sodium Bicarbonate (ZEGERID) 20-1100 MG CAPS capsule Take 1 capsule by mouth daily before breakfast.    [provider]  ?ondansetron (ZOFRAN) 4 MG tablet Take 4 mg every 8 (eight) hours as needed by mouth for nausea or vomiting.    [provider]  ?oxyCODONE-acetaminophen  (PERCOCET/ROXICET) 5-325 MG tablet Take 1 tablet every 4 (four) hours as needed by mouth for severe pain.    [provider]  ?oxyCODONE-acetaminophen (PERCOCET/ROXICET) 5-325 MG tablet Take 1 tablet by mouth every 6 (six) hours as needed for severe pain.    [provider]  ?phenazopyridine (PYRIDIUM) 200 MG tablet Take 1 tablet (200 mg total) by mouth 3 (three) times daily as needed for pain. 07/26/21   Bjorn PippinWrenn, John, MD  ?tamsulosin (FLOMAX) 0.4 MG CAPS capsule Take 0.4 mg by mouth.    [provider]  ?tamsulosin (FLOMAX) 0.4 MG CAPS capsule Take 0.4 mg by mouth 2 (two) times daily.    [provider]  ?Vitamin D, Ergocalciferol, 50 MCG (2000 UT) CAPS Take 1 tablet by mouth daily.    [provider]  ?   ? ?Allergies    ?Benadryl [diphenhydramine hcl] and Benadryl [diphenhydramine]   ? ?Review of Systems   ?Review of Systems  ?Genitourinary:  Positive for flank pain.  ?All other systems reviewed and are negative. ? ?Physical Exam ?Updated Vital Signs ?BP 111/67   Pulse 93   Temp 98 ?F (36.7 ?C)   Resp 18   LMP 07/20/2021 (Exact Date)   SpO2 95%  ? ?Physical Exam ?Vitals and nursing note reviewed.  ?Constitutional:   ?   Appearance: She is well-developed. She is not diaphoretic.  ?HENT:  ?   Head: Normocephalic and atraumatic.  ?Eyes:  ?   Conjunctiva/sclera: Conjunctivae normal.  ?  Pupils: Pupils are equal, round, and reactive to light.  ?Cardiovascular:  ?   Rate and Rhythm: Normal rate and regular rhythm.  ?   Heart sounds: Normal heart sounds.  ?Pulmonary:  ?   Effort: Pulmonary effort is normal. No respiratory distress.  ?   Breath sounds: Normal breath sounds. No rhonchi.  ?Abdominal:  ?   General: Bowel sounds are normal.  ?   Palpations: Abdomen is soft.  ?   Tenderness: There is no abdominal tenderness. There is no rebound.  ?Musculoskeletal:     ?   General: Normal range of motion.  ?   Cervical back: Normal range of motion.  ?Skin: ?   General: Skin is  warm and dry.  ?Neurological:  ?   Mental Status: She is alert and oriented to person, place, and time.  ? ? ?ED Results / Procedures / Treatments   ?Labs ?(all labs ordered are listed, but only abnormal results are displayed) ?Labs Reviewed  ?COMPREHENSIVE METABOLIC PANEL - Abnormal; Notable for the following components:  ?    Result Value  ? Glucose, Bld 112 (*)   ? All other components within normal limits  ?CBC WITH DIFFERENTIAL/PLATELET - Abnormal; Notable for the following components:  ? WBC 11.9 (*)   ? Platelets 423 (*)   ? Neutro Abs 8.0 (*)   ? All other components within normal limits  ?URINALYSIS, ROUTINE W REFLEX MICROSCOPIC - Abnormal; Notable for the following components:  ? Color, Urine AMBER (*)   ? Specific Gravity, Urine 1.002 (*)   ? Hgb urine dipstick LARGE (*)   ? Nitrite POSITIVE (*)   ? Leukocytes,Ua TRACE (*)   ? Bacteria, UA FEW (*)   ? All other components within normal limits  ?URINE CULTURE  ?I-STAT BETA HCG BLOOD, ED ([facility], [facility], [facility] ONLY)  ? ? ?EKG ?None ? ?Radiology ?CT Renal Stone Study ? ?Result Date: 07/29/2021 ?CLINICAL DATA:  Flank pain, kidney stone suspected EXAM: CT ABDOMEN AND PELVIS WITHOUT CONTRAST TECHNIQUE: Multidetector CT imaging of the abdomen and pelvis was performed following the standard protocol without IV contrast. RADIATION DOSE REDUCTION: This exam was performed according to the departmental dose-optimization program which includes automated exposure control, adjustment of the mA and/or kV according to patient size and/or use of iterative reconstruction technique. COMPARISON:  07/20/2021 FINDINGS: Lower chest: No acute abnormality. Hepatobiliary: Focal liver abnormality. Large calcified gallstone is again noted. No biliary dilatation. Pancreas: Unremarkable. Spleen: Unremarkable. Adrenals/Urinary Tract: Adrenals are unremarkable. Small nonobstructing bilateral renal calculi. There is left hydronephrosis and hydroureter with surrounding fat infiltration. The proximal  left ureteral calculus is no longer present. There are adjacent small stones or stone fragments at the uterovesical junction with the largest measuring 2 mm. Bladder is unremarkable. Stomach/Bowel: Stomach is within normal limits. Bowel is normal in caliber. Normal appendix. Vascular/Lymphatic: No significant vascular abnormality on this noncontrast study. No enlarged nodes. Reproductive: Uterus and bilateral adnexa are unremarkable. Other: No acute abnormality of the abdominal wall. Musculoskeletal: No acute osseous abnormality. IMPRESSION: Left hydroureteronephrosis. Small stones or stone fragments are present at the left ureterovesical junction measuring up to 2 mm. Bilateral nonobstructing renal calculi. Cholelithiasis. Electronically Signed   By: Guadlupe Spanish M.D.   On: 07/29/2021 21:09   ? ?Procedures ?Procedures  ? ? ?Medications Ordered in ED ?Medications  ?lidocaine (XYLOCAINE) 108 mg in sodium chloride 0.9 % 100 mL IVPB (0 mg Intravenous Stopped 07/30/21 0054)  ?cefTRIAXone (ROCEPHIN) 1 g in sodium chloride 0.9 %  100 mL IVPB (0 g Intravenous Stopped 07/30/21 0040)  ?fentaNYL (SUBLIMAZE) injection 50 mcg (50 mcg Intravenous Given 07/30/21 0018)  ? ? ?ED Course/ Medical Decision Making/ A&P ?  ?                        ?Medical Decision Making ?Amount and/or Complexity of Data Reviewed ?Labs: ordered. ?Radiology: ordered and independent interpretation performed. ?ECG/medicine tests: ordered and independent interpretation performed. ? ?Risk ?Prescription drug management. ? ? ?41 y.o. F presenting to the ED with left flank pain.  Hx kidney stones and had ureteral stent placed 07/26/20 with Dr. Annabell Howells.  She pulled stent out yesterday as instructed and has had severe pain since.  On my exam, states pain has actually subsided the most it has in the past 24 hours.  She has no focal CVA tenderness.  Labs obtained from triage and reviewed--mild leukocytosis but normal renal function without electrolyte derangement.  CT  with small stones noted at the left UVJ measuring up to 2 mm.  Her UA is nitrite positive with blood present, however has recently been on Pyridium so unsure if this is causing false positive.  We will send for culture.   ?

## 2021-07-30 MED ORDER — CEPHALEXIN 500 MG PO CAPS: 500.0000 mg | ORAL_CAPSULE | Freq: Three times a day (TID) | ORAL | 0 refills | Status: DC

## 2021-07-30 NOTE — Discharge Instructions (Signed)
Take the prescribed medication as directed. ?Follow-up with urology-- I would call in the morning and try to get a sooner appt. ?Return to the ED for new or worsening symptoms. ?

## 2021-07-31 LAB — URINE CULTURE

## 2021-09-02 ENCOUNTER — Other Ambulatory Visit: Payer: Self-pay | Admitting: Gastroenterology

## 2021-09-02 ENCOUNTER — Encounter: Payer: Self-pay | Admitting: Gastroenterology

## 2021-09-02 ENCOUNTER — Ambulatory Visit: Payer: 59 | Admitting: Gastroenterology

## 2021-09-02 ENCOUNTER — Ambulatory Visit (INDEPENDENT_AMBULATORY_CARE_PROVIDER_SITE_OTHER): Payer: Commercial Managed Care - HMO | Admitting: Gastroenterology

## 2021-09-02 VITALS — BP 132/84 | HR 82 | Ht 63.0 in | Wt 164.0 lb

## 2021-09-02 DIAGNOSIS — K219 Gastro-esophageal reflux disease without esophagitis: Secondary | ICD-10-CM | POA: Diagnosis not present

## 2021-09-02 MED ORDER — OMEPRAZOLE-SODIUM BICARBONATE 40-1100 MG PO CAPS: 1.0000 | ORAL_CAPSULE | Freq: Every day | ORAL | 4 refills | Status: DC

## 2021-09-02 NOTE — Telephone Encounter (Signed)
Can you see about getting a prior auth on this please? It says I can't send it to the rx pool auth team

## 2021-09-02 NOTE — Progress Notes (Signed)
Chief Complaint:   Referring Provider:  No ref. provider found      ASSESSMENT AND PLAN;   #1. GERD with small HH on CT. Failed omeprazole.  Doing well on Zegerid.  No red flag symptoms  #2.  Asymptomatic cholelithiasis.   Plan: -Zegerid 1 po QD to continue.  #90, 4RF -Nonpharmacological means of reflux control. -No need for cholecystectomy currently -Colon at age 41 for colorectal cancer screening.  Would recommend EGD at the same time. -I have gone over CT films with pt and pt's husband.  I have reassured them.  Also all contact numbers were given.  They will promptly get in touch with [place] in case of any GI problems until then.   HPI:    Felicia Tucker is a 41 y.o. female  With renal stones/UTI Accompanied by her husband  Here for abn CT AP showing small HH and gallstone.  No significant GI problems except for longstanding heartburn x yrs (started around pregnancy 15 years ago).  Failed omeprazole.  Zegerid working well.  No odynophagia or dysphagia.  No previous EGD.  No nausea, vomiting, regurgitation, odynophagia or dysphagia.  No significant diarrhea or constipation.  No melena or hematochezia. No unintentional weight loss. No abdominal pain.  No jaundice dark urine or pale stools.  No history of fatty food intolerance.  No sodas, chocolates, chewing gums, artificial sweeteners and candy. No NSAIDs   Past GI work-up  CT Abdo/pelvis with contrast 07/20/2021 1. 8 mm obstructing calculus at the left UPJ causing mild-to-moderate hydronephrosis. 2. There is also mild hydroureter distal to the left UPJ stone with subtle increased densities in the distal ureter near the UVJ. 3. Bilateral nephrolithiasis left greater than right. 4. Cholelithiasis. 5. Tiny hiatal hernia.   Past Medical History:  Diagnosis Date   Anxiety    GERD (gastroesophageal reflux disease)    Gestational diabetes    History of kidney stones    PONV (postoperative nausea and vomiting)     Renal disorder    Kidney Stones    Past Surgical History:  Procedure Laterality Date   CYSTOSCOPY/URETEROSCOPY/HOLMIUM LASER/STENT PLACEMENT Left 07/26/2021   Procedure: CYSTOSCOPY/URETEROSCOPY/HOLMIUM LASER/STENT PLACEMENT;  Surgeon: Bjorn Pippin, MD;  Location: [facility];  Service: Urology;  Laterality: Left;   DILATION AND CURETTAGE OF UTERUS     DILATION AND CURETTAGE OF UTERUS  2010   EXTRACORPOREAL SHOCK WAVE LITHOTRIPSY Left 02/16/2017   Procedure: LEFT EXTRACORPOREAL SHOCK WAVE LITHOTRIPSY (ESWL);  Surgeon: Debroah Baller, MD;  Location: [facility];  Service: Urology;  Laterality: Left;   LITHOTRIPSY     Multiple lithotripsy   LITHOTRIPSY     multiple- x10   WISDOM TOOTH EXTRACTION      Family History  Problem Relation Age of Onset   Ovarian cancer Mother    Diabetes Mother    Heart disease Father    Colon cancer Neg Hx    Rectal cancer Neg Hx    Stomach cancer Neg Hx     Social History   Tobacco Use   Smoking status: Former    Types: Cigarettes   Smokeless tobacco: Never  Vaping Use   Vaping Use: Never used  Substance Use Topics   Alcohol use: No   Drug use: No    Current Outpatient Medications  Medication Sig Dispense Refill   clonazePAM (KLONOPIN) 0.5 MG tablet Take 0.5 mg by mouth as needed for anxiety.     ibuprofen (ADVIL,MOTRIN) 200 MG tablet Take 400 mg by  mouth every 6 (six) hours as needed for headache.     Multiple Vitamins-Minerals (HAIR/SKIN/NAILS) CAPS Take 2 tablets by mouth daily.     naltrexone (DEPADE) 50 MG tablet Take 25 mg by mouth 2 (two) times daily.     omeprazole-sodium bicarbonate (ZEGERID) 40-1100 MG capsule Take 1 capsule by mouth daily before breakfast.     Vitamin D, Ergocalciferol, 50 MCG (2000 UT) CAPS Take 1 tablet by mouth daily.     buPROPion (WELLBUTRIN) 75 MG tablet Take 75 mg by mouth 2 (two) times daily.     No current facility-administered medications for this visit.    Allergies  Allergen Reactions   Benadryl  [Diphenhydramine Hcl] Other (See Comments)    IV benadryl caused rapid heart rate   Benadryl [Diphenhydramine] Other (See Comments)    Had IV Benadryl with lithotripsy, had increased heart rate    Review of Systems:  Constitutional: Denies fever, chills, diaphoresis, appetite change and fatigue.  HEENT: Denies photophobia, eye pain, redness, hearing loss, ear pain, congestion, sore throat, rhinorrhea, sneezing, mouth sores, neck pain, neck stiffness and tinnitus.   Respiratory: Denies SOB, DOE, cough, chest tightness,  and wheezing.   Cardiovascular: Denies chest pain, palpitations and leg swelling.  Genitourinary: Denies dysuria, urgency, frequency, hematuria, flank pain and difficulty urinating.  Musculoskeletal: Denies myalgias, back pain, joint swelling, arthralgias and gait problem.  Skin: No rash.  Neurological: Denies dizziness, seizures, syncope, weakness, light-headedness, numbness and headaches.  Hematological: Denies adenopathy. Easy bruising, personal or family bleeding history  Psychiatric/Behavioral: No anxiety or depression     Physical Exam:    BP 132/84   Pulse 82   Ht 5\' 3"  (1.6 m)   Wt 164 lb (74.4 kg)   SpO2 98%   BMI 29.05 kg/m  Wt Readings from Last 3 Encounters:  09/02/21 164 lb (74.4 kg)  07/26/21 159 lb 3.2 oz (72.2 kg)  02/16/17 140 lb 6 oz (63.7 kg)   Constitutional:  Well-developed, in no acute distress. Psychiatric: Normal mood and affect. Behavior is normal. HEENT: Pupils normal.  Conjunctivae are normal. No scleral icterus. Neck supple.  Cardiovascular: Normal rate, regular rhythm. No edema Pulmonary/chest: Effort normal and breath sounds normal. No wheezing, rales or rhonchi. Abdominal: Soft, nondistended. Nontender. Bowel sounds active throughout. There are no masses palpable. No hepatomegaly. Rectal: Deferred Neurological: Alert and oriented to person place and time. Skin: Skin is warm and dry. No rashes noted.  Data Reviewed: I have  personally reviewed following labs and imaging studies  CBC:    Latest Ref Rng & Units 07/29/2021    8:23 PM 07/26/2021    3:45 PM  CBC  WBC 4.0 - 10.5 K/uL 11.9   9.1    Hemoglobin 12.0 - 15.0 g/dL 07/28/2021   28.7    Hematocrit 36.0 - 46.0 % 38.2   39.8    Platelets 150 - 400 K/uL 423   374      CMP:    Latest Ref Rng & Units 07/29/2021    8:23 PM 07/26/2021    3:45 PM  CMP  Glucose 70 - 99 mg/dL 07/28/2021   89    BUN 6 - 20 mg/dL 11   12    Creatinine 157 - 1.00 mg/dL 2.62   0.35    Sodium 5.97 - 145 mmol/L 139   139    Potassium 3.5 - 5.1 mmol/L 4.6   4.0    Chloride 98 - 111 mmol/L 106   108  CO2 22 - 32 mmol/L 25   22    Calcium 8.9 - 10.3 mg/dL 9.8   8.9    Total Protein 6.5 - 8.1 g/dL 6.6     Total Bilirubin 0.3 - 1.2 mg/dL 0.6     Alkaline Phos 38 - 126 U/L 41     AST 15 - 41 U/L 15     ALT 0 - 44 U/L 19           Felicia Circleaj Doneen Ollinger, MD 09/02/2021, 11:09 AM  Cc: No ref. provider found

## 2021-09-02 NOTE — Patient Instructions (Addendum)
If you are age 43 or older, your body mass index should be between 23-30. Your Body mass index is 29.05 kg/m. If this is out of the aforementioned range listed, please consider follow up with your Primary Care Provider.  If you are age 27 or younger, your body mass index should be between 19-25. Your Body mass index is 29.05 kg/m. If this is out of the aformentioned range listed, please consider follow up with your Primary Care Provider.   ________________________________________________________  The [facility] providers would like to encourage you to use [facility] to communicate with providers for non-urgent requests or questions.  Due to long hold times on the telephone, sending your provider a message by [facility] may be a faster and more efficient way to get a response.  Please allow 48 business hours for a response.  Please remember that this is for non-urgent requests.  _______________________________________________________  We have sent the following medications to your pharmacy for you to pick up at your convenience: Zegerid  Colon at age 10. Please call us [phone] to set this up  Please call with any questions or concerns.  Thank you,  Dr. Lynann Bologna

## 2021-09-03 ENCOUNTER — Other Ambulatory Visit (HOSPITAL_COMMUNITY): Payer: Self-pay

## 2021-09-03 ENCOUNTER — Telehealth: Payer: Self-pay | Admitting: Pharmacy Technician

## 2021-09-03 NOTE — Telephone Encounter (Signed)
Patient Advocate Encounter  Received notification from [place] that prior authorization for ZEGRID [id number]) is required.   PA submitted on 5.26.23 Key [id number] Status is pending    Pt has not tried ALL alternatives and may result in a denial. Will continue to follow  Luciano Cutter, CPhT Patient Advocate Phone: [phone]

## 2021-09-10 ENCOUNTER — Other Ambulatory Visit (HOSPITAL_COMMUNITY): Payer: Self-pay

## 2021-09-10 NOTE — Telephone Encounter (Signed)
Received a fax regarding Prior Authorization from [employer] for ZEGRID [id number] [facility]). Authorization has been DENIED because .

## 2021-09-14 NOTE — Telephone Encounter (Signed)
Patient said that she wasn't able to pick up her medication Zegrid due to insurance denying it. She didn't say how much it would be from the pharmacy because she doesn't know but she is doing the [facility] generic that is over the counter and it seems to be working she says. Any advise?

## 2021-09-20 NOTE — Telephone Encounter (Signed)
Please take [facility] brand over-the-counter  that will be least expensive RG

## 2021-09-27 NOTE — Telephone Encounter (Signed)
Patient is aware and understands.

## 2021-12-14 ENCOUNTER — Telehealth: Payer: Self-pay

## 2021-12-14 NOTE — Telephone Encounter (Signed)
Phone number: [phone] Insurance: [id number]  The pt listed above is requesting to schedule an apt. Pt was notified that she has not been seen since 2018. Pt was notified of office policy for pts that have not been seen in three years. Patient stated that she has only been doing virtual appts and has not seen a PCP since her last appointment.  Is it ok to schedule her as a new pt to re-est?

## 2021-12-17 ENCOUNTER — Telehealth: Payer: Self-pay

## 2021-12-17 NOTE — Telephone Encounter (Signed)
Appointment has been scheduled-ok per dr. cox

## 2022-01-04 ENCOUNTER — Encounter: Payer: Self-pay | Admitting: Nurse Practitioner

## 2022-01-04 ENCOUNTER — Other Ambulatory Visit: Payer: Self-pay | Admitting: Nurse Practitioner

## 2022-01-04 ENCOUNTER — Ambulatory Visit (INDEPENDENT_AMBULATORY_CARE_PROVIDER_SITE_OTHER): Payer: Commercial Managed Care - HMO | Admitting: Nurse Practitioner

## 2022-01-04 VITALS — BP 122/64 | HR 103 | Temp 97.1°F | Ht 63.0 in | Wt 165.0 lb

## 2022-01-04 DIAGNOSIS — K8020 Calculus of gallbladder without cholecystitis without obstruction: Secondary | ICD-10-CM | POA: Diagnosis not present

## 2022-01-04 DIAGNOSIS — F411 Generalized anxiety disorder: Secondary | ICD-10-CM | POA: Diagnosis not present

## 2022-01-04 DIAGNOSIS — Z6829 Body mass index (BMI) 29.0-29.9, adult: Secondary | ICD-10-CM

## 2022-01-04 DIAGNOSIS — F321 Major depressive disorder, single episode, moderate: Secondary | ICD-10-CM

## 2022-01-04 DIAGNOSIS — K219 Gastro-esophageal reflux disease without esophagitis: Secondary | ICD-10-CM | POA: Diagnosis not present

## 2022-01-04 DIAGNOSIS — Z1231 Encounter for screening mammogram for malignant neoplasm of breast: Secondary | ICD-10-CM

## 2022-01-04 DIAGNOSIS — Z87442 Personal history of urinary calculi: Secondary | ICD-10-CM | POA: Diagnosis not present

## 2022-01-04 DIAGNOSIS — F419 Anxiety disorder, unspecified: Secondary | ICD-10-CM

## 2022-01-04 MED ORDER — CLONAZEPAM 0.5 MG PO TABS: 0.5000 mg | ORAL_TABLET | ORAL | 1 refills | Status: DC | PRN

## 2022-01-04 MED ORDER — PANTOPRAZOLE SODIUM 40 MG PO TBEC: 40.0000 mg | DELAYED_RELEASE_TABLET | Freq: Every day | ORAL | 3 refills | Status: DC

## 2022-01-04 MED ORDER — VORTIOXETINE HBR 5 MG PO TABS: 5.0000 mg | ORAL_TABLET | Freq: Every day | ORAL | 0 refills | Status: DC

## 2022-01-04 NOTE — Progress Notes (Signed)
New Patient Office Visit  Subjective    Patient ID: Felicia Tucker, female    DOB: October 16, 1980  Age: 41 y.o. MRN: [id number]  CC:  Chief Complaint  Patient presents with   Establish Care    HPI Felicia Tucker presents to establish care. 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 Reports she has been under increased stress the past few months. States she would like to discuss anxiety and depression.States she and her spouse needed unexpected surgery earlier this year and both have been laid off from their jobs. She was previously in therapy but service is no longer covered by her current health insurance. Currently prescribed Klonopin 0.5 mg PRN for anxiety  Felicia Tucker reports health history of GERD and chronic kidney stones. Reports an incidental finding of cholelithiasis on an imaging study. Denies current symptoms of gallbladder dysfunction.   Anxiety Prescribed Klonipin 0.5 mg PRN for anxiety.  GAD-7 Results    01/04/2022    1:57 PM  GAD-7 Generalized Anxiety Disorder Screening Tool  1. Feeling Nervous, Anxious, or on Edge 3  2. Not Being Able to Stop or Control Worrying 3  3. Worrying Too Much About Different Things 3  4. Trouble Relaxing 3  5. Being So Restless it's Hard To Sit Still 2  6. Becoming Easily Annoyed or Irritable 3  7. Feeling Afraid As If Something Awful Might Happen 1  Total GAD-7 Score 18  Difficulty At Work, Home, or Getting  Along With Others? Somewhat difficult   Depression  Pt was previously prescribed Wellbutrin, unable to tolerate side effects of increased anxiety.Not currently in counseling. Hopes to resume in November 2023 after insurance change.     01/04/2022    1:58 PM  Depression screen PHQ 2/9  Decreased Interest 2  Down, Depressed, Hopeless 2  PHQ - 2 Score 4  Altered sleeping 0  Tired, decreased energy 2  Change in appetite 1  Feeling bad or failure about yourself  3  Trouble concentrating 3  Moving slowly or fidgety/restless 0   Suicidal thoughts 0  PHQ-9 Score 13  Difficult doing work/chores Somewhat difficult      Outpatient Encounter Medications as of 01/04/2022  Medication Sig   clonazePAM (KLONOPIN) 0.5 MG tablet Take 0.5 mg by mouth as needed for anxiety.   Multiple Vitamins-Minerals (HAIR/SKIN/NAILS) CAPS Take 2 tablets by mouth daily.   omeprazole-sodium bicarbonate (ZEGERID) 40-1100 MG capsule TAKE 1 CAPSULE BY MOUTH EVERY DAY BEFORE BREAKFAST   [DISCONTINUED] buPROPion (WELLBUTRIN) 75 MG tablet Take 75 mg by mouth 2 (two) times daily.   [DISCONTINUED] ibuprofen (ADVIL,MOTRIN) 200 MG tablet Take 400 mg by mouth every 6 (six) hours as needed for headache.   [DISCONTINUED] naltrexone (DEPADE) 50 MG tablet Take 25 mg by mouth 2 (two) times daily.   [DISCONTINUED] Vitamin D, Ergocalciferol, 50 MCG (2000 UT) CAPS Take 1 tablet by mouth daily.   No facility-administered encounter medications on file as of 01/04/2022.    Past Medical History:  Diagnosis Date   Anxiety    GERD (gastroesophageal reflux disease)    Gestational diabetes    History of kidney stones    PONV (postoperative nausea and vomiting)    Renal disorder    Kidney Stones    Past Surgical History:  Procedure Laterality Date   CYSTOSCOPY/URETEROSCOPY/HOLMIUM LASER/STENT PLACEMENT Left 07/26/2021   Procedure: CYSTOSCOPY/URETEROSCOPY/HOLMIUM LASER/STENT PLACEMENT;  Surgeon: Irine Seal, MD;  Location: [facility];  Service: Urology;  Laterality: Left;   DILATION AND CURETTAGE OF UTERUS  DILATION AND CURETTAGE OF UTERUS  2010   EXTRACORPOREAL SHOCK WAVE LITHOTRIPSY Left 02/16/2017   Procedure: LEFT EXTRACORPOREAL SHOCK WAVE LITHOTRIPSY (ESWL);  Surgeon: Debroah Baller, MD;  Location: [facility];  Service: Urology;  Laterality: Left;   LITHOTRIPSY     Multiple lithotripsy   LITHOTRIPSY     multiple- x10   WISDOM TOOTH EXTRACTION      Family History  Problem Relation Age of Onset   Ovarian cancer Mother    Diabetes Mother    Heart disease  Father    Colon cancer Neg Hx    Rectal cancer Neg Hx    Stomach cancer Neg Hx     Social History   Socioeconomic History   Marital status: Married    Spouse name: Felicia Tucker   Number of children: 2   Years of education: Not on file   Highest education level: Not on file  Occupational History   Occupation: Self Employed  Tobacco Use   Smoking status: Former    Types: Cigarettes   Smokeless tobacco: Never  Vaping Use   Vaping Use: Never used  Substance and Sexual Activity   Alcohol use: Yes    Comment: Socially   Drug use: No   Sexual activity: Yes    Partners: Male  Other Topics Concern   Not on file  Social History Narrative   ** Merged History Encounter **       Social Determinants of Health   Financial Resource Strain: Low Risk  (01/04/2022)   Overall Financial Resource Strain (CARDIA)    Difficulty of Paying Living Expenses: Not hard at all  Food Insecurity: No Food Insecurity (01/04/2022)   Hunger Vital Sign    Worried About Running Out of Food in the Last Year: Never true    Ran Out of Food in the Last Year: Never true  Transportation Needs: No Transportation Needs (01/04/2022)   PRAPARE - Administrator, Civil Service (Medical): No    Lack of Transportation (Non-Medical): No  Physical Activity: Sufficiently Active (01/04/2022)   Exercise Vital Sign    Days of Exercise per Week: 3 days    Minutes of Exercise per Session: 50 min  Stress: No Stress Concern Present (01/04/2022)   [employer] of Occupational Health - Occupational Stress Questionnaire    Feeling of Stress : Not at all  Social Connections: Moderately Integrated (01/04/2022)   Social Connection and Isolation Panel [NHANES]    Frequency of Communication with Friends and Family: More than three times a week    Frequency of Social Gatherings with Friends and Family: More than three times a week    Attends Religious Services: More than 4 times per year    Active Member of [employer] or  Organizations: No    Attends Banker Meetings: Never    Marital Status: Married  Catering manager Violence: Not At Risk (01/04/2022)   Humiliation, Afraid, Rape, and Kick questionnaire    Fear of Current or Ex-Partner: No    Emotionally Abused: No    Physically Abused: No    Sexually Abused: No    Review of Systems  Constitutional:  Negative for chills, fever and malaise/fatigue.  HENT:  Negative for ear pain, sinus pain and sore throat.   Respiratory:  Negative for cough and shortness of breath.   Cardiovascular:  Negative for chest pain.  Musculoskeletal:  Positive for neck pain (left sided- meloxicam in the past has not helped.). Negative for myalgias.  Neurological:  Negative for headaches.  Psychiatric/Behavioral:  Positive for depression. The patient is nervous/anxious.         Objective    BP 122/64   Pulse (!) 103   Temp (!) 97.1 F (36.2 C)   Ht 5\' 3"  (1.6 m)   Wt 165 lb (74.8 kg)   LMP 12/11/2021   SpO2 100%   BMI 29.23 kg/m   Physical Exam Vitals reviewed.  Constitutional:      Appearance: Normal appearance.  HENT:     Right Ear: Tympanic membrane normal.     Left Ear: Tympanic membrane normal.     Nose: Nose normal.     Mouth/Throat:     Mouth: Mucous membranes are moist.  Cardiovascular:     Rate and Rhythm: Regular rhythm. Tachycardia present.     Pulses: Normal pulses.     Heart sounds: Normal heart sounds.  Pulmonary:     Effort: Pulmonary effort is normal.     Breath sounds: Normal breath sounds.  Abdominal:     General: Bowel sounds are normal.     Palpations: Abdomen is soft.  Skin:    General: Skin is warm and dry.     Capillary Refill: Capillary refill takes less than 2 seconds.  Neurological:     General: No focal deficit present.     Mental Status: She is alert and oriented to person, place, and time.  Psychiatric:        Mood and Affect: Mood normal.        Behavior: Behavior normal.        Assessment & Plan:    1. Gastroesophageal reflux disease, unspecified whether esophagitis present - pantoprazole (PROTONIX) 40 MG tablet; Take 1 tablet (40 mg total) by mouth daily.  Dispense: 90 tablet; Refill: 3  2. Calculus of gallbladder without cholecystitis without obstruction -low fat gallbladder diet  3. Personal history of kidney stones -push fluids, especially water -monitor for symptoms  4. GAD (generalized anxiety disorder) - vortioxetine HBr (TRINTELLIX) 5 MG TABS tablet; Take 1 tablet (5 mg total) by mouth daily.  Dispense: 21 tablet; Refill: 0  5. Depression, major, single episode, moderate (HCC) - vortioxetine HBr (TRINTELLIX) 5 MG TABS tablet; Take 1 tablet (5 mg total) by mouth daily.  Dispense: 21 tablet; Refill: 0  6. Screening mammogram for breast cancer - MM DIGITAL SCREENING BILATERAL; Future    Begin Trintellix 5 mg for depression/anxiety Begin Protonix 20 mg daily for GERD Stop medications immediately for any adverse side effects We will call you with mammogram appointment Follow-up in 4 weeks  Follow-up: 4 weeks  I, 02/10/2022, NP, have reviewed all documentation for this visit. The documentation on 01/04/22 for the exam, diagnosis, procedures, and orders are all accurate and complete.    Signed09/28/23, DNP 01/04/22 at 2:49 pm

## 2022-01-04 NOTE — Patient Instructions (Addendum)
Begin Trintellix 5 mg for depression/anxiety Begin Protonix 20 mg daily for GERD Stop medications immediately for any adverse side effects We will call you with mammogram appointment Follow-up in 4 weeks  Managing Depression, Adult Depression is a mental health condition that affects your thoughts, feelings, and actions. Being diagnosed with depression can bring you relief if you did not know why you have felt or behaved a certain way. It could also leave you feeling overwhelmed with uncertainty about your future. Preparing yourself to manage your symptoms can help you feel more positive about your future. How to manage lifestyle changes Managing stress  Stress is your body's reaction to life changes and events, both good and bad. Stress can add to your feelings of depression. Learning to manage your stress can help lessen your feelings of depression. Try some of the following approaches to reducing your stress (stress reduction techniques): Listen to music that you enjoy and that inspires you. Try using a meditation app or take a meditation class. Develop a practice that helps you connect with your spiritual self. Walk in nature, pray, or go to a place of worship. Do some deep breathing. To do this, inhale slowly through your nose. Pause at the top of your inhale for a few seconds and then exhale slowly, letting your muscles relax. Practice yoga to help relax and work your muscles. Choose a stress reduction technique that suits your lifestyle and personality. These techniques take time and practice to develop. Set aside 5-15 minutes a day to do them. Therapists can offer training in these techniques. Other things you can do to manage stress include: Keeping a stress diary. Knowing your limits and saying no when you think something is too much. Paying attention to how you react to certain situations. You may not be able to control everything, but you can change your reaction. Adding humor to  your life by watching funny films or TV shows. Making time for activities that you enjoy and that relax you.  Medicines Medicines, such as antidepressants, are often a part of treatment for depression. Talk with your pharmacist or health care provider about all the medicines, supplements, and herbal products that you take, their possible side effects, and what medicines and other products are safe to take together. Make sure to report any side effects you may have to your health care provider. Relationships Your health care provider may suggest family therapy, couples therapy, or individual therapy as part of your treatment. How to recognize changes Everyone responds differently to treatment for depression. As you recover from depression, you may start to: Have more interest in doing activities. Feel less hopeless. Have more energy. Overeat less often, or have a better appetite. Have better mental focus. It is important to recognize if your depression is not getting better or is getting worse. The symptoms you had in the beginning may return, such as: Tiredness (fatigue) or low energy. Eating too much or too little. Sleeping too much or too little. Feeling restless, agitated, or hopeless. Trouble focusing or making decisions. Unexplained physical complaints. Feeling irritable, angry, or aggressive. If you or your family members notice these symptoms coming back, let your health care provider know right away. Follow these instructions at home: Activity  Try to get some form of exercise each day, such as walking, biking, swimming, or lifting weights. Practice stress reduction techniques. Engage your mind by taking a class or doing some volunteer work. Lifestyle Get the right amount and quality of sleep. Cut  down on using caffeine, tobacco, alcohol, and other potentially harmful substances. Eat a healthy diet that includes plenty of vegetables, fruits, whole grains, low-fat dairy  products, and lean protein. Do not eat a lot of foods that are high in solid fats, added sugars, or salt (sodium). General instructions Take over-the-counter and prescription medicines only as told by your health care provider. Keep all follow-up visits as told by your health care provider. This is important. Where to find support Talking to others  Friends and family members can be sources of support and guidance. Talk to trusted friends or family members about your condition. Explain your symptoms to them, and let them know that you are working with a health care provider to treat your depression. Tell friends and family members how they also can be helpful. Finances Find appropriate mental health providers that fit with your financial situation. Talk with your health care provider about options to get reduced prices on your medicines. Where to find more information You can find support in your area from: Anxiety and Depression Association of America (ADAA): www.adaa.org Mental Health America: www.mentalhealthamerica.net The [employer] on Mental Illness: www.nami.org Contact a health care provider if: You stop taking your antidepressant medicines, and you have any of these symptoms: Nausea. Headache. Light-headedness. Chills and body aches. Not being able to sleep (insomnia). You or your friends and family think your depression is getting worse. Get help right away if: You have thoughts of hurting yourself or others. If you ever feel like you may hurt yourself or others, or have thoughts about taking your own life, get help right away. Go to your nearest emergency department or: Call your local emergency services (911 in the [place] Call a suicide crisis helpline, such as the National Suicide Prevention Lifeline at 484-435-7889 or 988 in the [place] This is open 24 hours a day in the [place] Text the Crisis Text Line at 817-629-6850 (in the [place] Summary If you are diagnosed with  depression, preparing yourself to manage your symptoms is a good way to feel positive about your future. Work with your health care provider on a management plan that includes stress reduction techniques, medicines (if applicable), therapy, and healthy lifestyle habits. Keep talking with your health care provider about how your treatment is working. If you have thoughts about taking your own life, call a suicide crisis helpline or text a crisis text line. This information is not intended to replace advice given to you by your health care provider. Make sure you discuss any questions you have with your health care provider. Document Revised: 10/21/2020 Document Reviewed: 02/06/2019 Elsevier Patient Education  2023 Elsevier Inc.   Managing Anxiety, Adult After being diagnosed with anxiety, you may be relieved to know why you have felt or behaved a certain way. You may also feel overwhelmed about the treatment ahead and what it will mean for your life. With care and support, you can manage this condition. How to manage lifestyle changes Managing stress and anxiety  Stress is your body's reaction to life changes and events, both good and bad. Most stress will last just a few hours, but stress can be ongoing and can lead to more than just stress. Although stress can play a major role in anxiety, it is not the same as anxiety. Stress is usually caused by something external, such as a deadline, test, or competition. Stress normally passes after the triggering event has ended.  Anxiety is caused by something internal, such as imagining a  terrible outcome or worrying that something will go wrong that will devastate you. Anxiety often does not go away even after the triggering event is over, and it can become long-term (chronic) worry. It is important to understand the differences between stress and anxiety and to manage your stress effectively so that it does not lead to an anxious response. Talk with your  health care provider or a counselor to learn more about reducing anxiety and stress. He or she may suggest tension reduction techniques, such as: Music therapy. Spend time creating or listening to music that you enjoy and that inspires you. Mindfulness-based meditation. Practice being aware of your normal breaths while not trying to control your breathing. It can be done while sitting or walking. Centering prayer. This involves focusing on a word, phrase, or sacred image that means something to you and brings you peace. Deep breathing. To do this, expand your stomach and inhale slowly through your nose. Hold your breath for 3-5 seconds. Then exhale slowly, letting your stomach muscles relax. Self-talk. Learn to notice and identify thought patterns that lead to anxiety reactions and change those patterns to thoughts that feel peaceful. Muscle relaxation. Taking time to tense muscles and then relax them. Choose a tension reduction technique that fits your lifestyle and personality. These techniques take time and practice. Set aside 5-15 minutes a day to do them. Therapists can offer counseling and training in these techniques. The training to help with anxiety may be covered by some insurance plans. Other things you can do to manage stress and anxiety include: Keeping a stress diary. This can help you learn what triggers your reaction and then learn ways to manage your response. Thinking about how you react to certain situations. You may not be able to control everything, but you can control your response. Making time for activities that help you relax and not feeling guilty about spending your time in this way. Doing visual imagery. This involves imagining or creating mental pictures to help you relax. Practicing yoga. Through yoga poses, you can lower tension and promote relaxation.  Medicines Medicines can help ease symptoms. Medicines for anxiety include: Antidepressant medicines. These are  usually prescribed for long-term daily control. Anti-anxiety medicines. These may be added in severe cases, especially when panic attacks occur. Medicines will be prescribed by a health care provider. When used together, medicines, psychotherapy, and tension reduction techniques may be the most effective treatment. Relationships Relationships can play a big part in helping you recover. Try to spend more time connecting with trusted friends and family members. Consider going to couples counseling if you have a partner, taking family education classes, or going to family therapy. Therapy can help you and others better understand your condition. How to recognize changes in your anxiety Everyone responds differently to treatment for anxiety. Recovery from anxiety happens when symptoms decrease and stop interfering with your daily activities at home or work. This may mean that you will start to: Have better concentration and focus. Worry will interfere less in your daily thinking. Sleep better. Be less irritable. Have more energy. Have improved memory. It is also important to recognize when your condition is getting worse. Contact your health care provider if your symptoms interfere with home or work and you feel like your condition is not improving. Follow these instructions at home: Activity Exercise. Adults should do the following: Exercise for at least 150 minutes each week. The exercise should increase your heart rate and make you sweat (moderate-intensity exercise).  Strengthening exercises at least twice a week. Get the right amount and quality of sleep. Most adults need 7-9 hours of sleep each night. Lifestyle  Eat a healthy diet that includes plenty of vegetables, fruits, whole grains, low-fat dairy products, and lean protein. Do not eat a lot of foods that are high in fats, added sugars, or salt (sodium). Make choices that simplify your life. Do not use any products that contain  nicotine or tobacco. These products include cigarettes, chewing tobacco, and vaping devices, such as e-cigarettes. If you need help quitting, ask your health care provider. Avoid caffeine, alcohol, and certain over-the-counter cold medicines. These may make you feel worse. Ask your pharmacist which medicines to avoid. General instructions Take over-the-counter and prescription medicines only as told by your health care provider. Keep all follow-up visits. This is important. Where to find support You can get help and support from these sources: Self-help groups. Online and [employer]. A trusted spiritual leader. Couples counseling. Family education classes. Family therapy. Where to find more information You may find that joining a support group helps you deal with your anxiety. The following sources can help you locate counselors or support groups near you: Mental Health America: www.mentalhealthamerica.net Anxiety and Depression Association of [place] (ADAA): [employer] on Mental Illness (NAMI): www.nami.org Contact a health care provider if: You have a hard time staying focused or finishing daily tasks. You spend many hours a day feeling worried about everyday life. You become exhausted by worry. You start to have headaches or frequently feel tense. You develop chronic nausea or diarrhea. Get help right away if: You have a racing heart and shortness of breath. You have thoughts of hurting yourself or others. If you ever feel like you may hurt yourself or others, or have thoughts about taking your own life, get help right away. Go to your nearest emergency department or: Call your local emergency services (911 in the [place] Call a suicide crisis helpline, such as the National Suicide Prevention Lifeline at (330) 651-5901 or 988 in the [place] This is open 24 hours a day in the [place] Text the Crisis Text Line at 385 855 2504 (in the [place] Summary Taking steps to  learn and use tension reduction techniques can help calm you and help prevent triggering an anxiety reaction. When used together, medicines, psychotherapy, and tension reduction techniques may be the most effective treatment. Family, friends, and partners can play a big part in supporting you. This information is not intended to replace advice given to you by your health care provider. Make sure you discuss any questions you have with your health care provider. Document Revised: 10/21/2020 Document Reviewed: 07/19/2020 Elsevier Patient Education  2023 Elsevier Inc.    Gallbladder Eating Plan High blood cholesterol, obesity, a sedentary lifestyle, an unhealthy diet, and diabetes are risk factors for developing gallstones. If you have a gallbladder condition, you may have trouble digesting fats and tolerating high fat intake. Eating a low-fat diet can help reduce your symptoms and may be helpful before and after having surgery to remove your gallbladder (cholecystectomy). Your health care provider may recommend that you work with a dietitian to help you reduce the amount of fat in your diet. What are tips for following this plan? General guidelines Limit your fat intake to less than 30% of your total daily calories. If you eat around 1,800 calories each day, this means eating less than 60 grams (g) of fat per day. Fat is an important part of a healthy  diet. Eating a low-fat diet can make it hard to maintain a healthy body weight. Ask your dietitian how much fat, calories, and other nutrients you need each day. Eat small, frequent meals throughout the day instead of three large meals. Drink at least 8-10 cups (1.9-2.4 L) of fluid a day. Drink enough fluid to keep your urine pale yellow. If you drink alcohol: Limit how much you have to: 0-1 drink a day for women who are not pregnant. 0-2 drinks a day for men. Know how much alcohol is in a drink. In the [place] one drink equals one 12 oz bottle of  beer (355 mL), one 5 oz glass of wine (148 mL), or one 1 oz glass of hard liquor (44 mL). Reading food labels  Check nutrition facts on food labels for the amount of fat per serving. Choose foods with less than 3 grams of fat per serving. Shopping Choose nonfat and low-fat healthy foods. Look for the words "nonfat," "low-fat," or "fat-free." Avoid buying processed or prepackaged foods. Cooking Cook using low-fat methods, such as baking, broiling, grilling, or boiling. Cook with small amounts of healthy fats, such as olive oil, grapeseed oil, canola oil, avocado oil, or sunflower oil. What foods are recommended?  All fresh, frozen, or canned fruits and vegetables. Whole grains. Low-fat or nonfat (skim) milk and yogurt. Lean meat, skinless poultry, fish, eggs, and beans. Low-fat protein supplement powders or drinks. Spices and herbs. The items listed above may not be a complete list of foods and beverages you can eat and drink. Contact a dietitian for more information. What foods are not recommended? High-fat foods. These include baked goods, fast food, fatty cuts of meat, ice cream, french toast, sweet rolls, pizza, cheese bread, foods covered with butter, creamy sauces, or cheese. Fried foods. These include french fries, tempura, battered fish, breaded chicken, fried breads, and sweets. Foods that cause bloating and gas. The items listed above may not be a complete list of foods that you should avoid. Contact a dietitian for more information. Summary A low-fat diet can be helpful if you have a gallbladder condition, or before and after gallbladder surgery. Limit your fat intake to less than 30% of your total daily calories. This is about 60 g of fat if you eat 1,800 calories each day. Eat small, frequent meals throughout the day instead of three large meals. This information is not intended to replace advice given to you by your health care provider. Make sure you discuss any questions  you have with your health care provider. Document Revised: 03/12/2021 Document Reviewed: 03/12/2021 Elsevier Patient Education  2023 Elsevier Inc.      Food Choices for Gastroesophageal Reflux Disease, Adult When you have gastroesophageal reflux disease (GERD), the foods you eat and your eating habits are very important. Choosing the right foods can help ease your discomfort. Think about working with a food expert (dietitian) to help you make good choices. What are tips for following this plan? Reading food labels Look for foods that are low in saturated fat. Foods that may help with your symptoms include: Foods that have less than 5% of daily value (DV) of fat. Foods that have 0 grams of trans fat. Cooking Do not fry your food. Cook your food by baking, steaming, grilling, or broiling. These are all methods that do not need a lot of fat for cooking. To add flavor, try to use herbs that are low in spice and acidity. Meal planning  Choose healthy foods  that are low in fat, such as: Fruits and vegetables. Whole grains. Low-fat dairy products. Lean meats, fish, and poultry. Eat small meals often instead of eating 3 large meals each day. Eat your meals slowly in a place where you are relaxed. Avoid bending over or lying down until 2-3 hours after eating. Limit high-fat foods such as fatty meats or fried foods. Limit your intake of fatty foods, such as oils, butter, and shortening. Avoid the following as told by your doctor: Foods that cause symptoms. These may be different for different people. Keep a food diary to keep track of foods that cause symptoms. Alcohol. Drinking a lot of liquid with meals. Eating meals during the 2-3 hours before bed. Lifestyle Stay at a healthy weight. Ask your doctor what weight is healthy for you. If you need to lose weight, work with your doctor to do so safely. Exercise for at least 30 minutes on 5 or more days each week, or as told by your  doctor. Wear loose-fitting clothes. Do not smoke or use any products that contain nicotine or tobacco. If you need help quitting, ask your doctor. Sleep with the head of your bed higher than your feet. Use a wedge under the mattress or blocks under the bed frame to raise the head of the bed. Chew sugar-free gum after meals. What foods should eat?  Eat a healthy, well-balanced diet of fruits, vegetables, whole grains, low-fat dairy products, lean meats, fish, and poultry. Each person is different. Foods that may cause symptoms in one person may not cause any symptoms in another person. Work with your doctor to find foods that are safe for you. The items listed above may not be a complete list of what you can eat and drink. Contact a food expert for more options. What foods should I avoid? Limiting some of these foods may help in managing the symptoms of GERD. Everyone is different. Talk with a food expert or your doctor to help you find the exact foods to avoid, if any. Fruits Any fruits prepared with added fat. Any fruits that cause symptoms. For some people, this may include citrus fruits, such as oranges, grapefruit, pineapple, and lemons. Vegetables Deep-fried vegetables. [place] fries. Any vegetables prepared with added fat. Any vegetables that cause symptoms. For some people, this may include tomatoes and tomato products, chili peppers, onions and garlic, and horseradish. Grains Pastries or quick breads with added fat. Meats and other proteins High-fat meats, such as fatty beef or pork, hot dogs, ribs, ham, sausage, salami, and bacon. Fried meat or protein, including fried fish and fried chicken. Nuts and nut butters, in large amounts. Dairy Whole milk and chocolate milk. Sour cream. Cream. Ice cream. Cream cheese. Milkshakes. Fats and oils Butter. Margarine. Shortening. Ghee. Beverages Coffee and tea, with or without caffeine. Carbonated beverages. Sodas. Energy drinks. Fruit juice made  with acidic fruits, such as orange or grapefruit. Tomato juice. Alcoholic drinks. Sweets and desserts Chocolate and cocoa. Donuts. Seasonings and condiments Pepper. Peppermint and spearmint. Added salt. Any condiments, herbs, or seasonings that cause symptoms. For some people, this may include curry, hot sauce, or vinegar-based salad dressings. The items listed above may not be a complete list of what you should not eat and drink. Contact a food expert for more options. Questions to ask your doctor Diet and lifestyle changes are often the first steps that are taken to manage symptoms of GERD. If diet and lifestyle changes do not help, talk with your doctor  about taking medicines. Where to find more information International Foundation for Gastrointestinal Disorders: aboutgerd.org Summary When you have GERD, food and lifestyle choices are very important in easing your symptoms. Eat small meals often instead of 3 large meals a day. Eat your meals slowly and in a place where you are relaxed. Avoid bending over or lying down until 2-3 hours after eating. Limit high-fat foods such as fatty meats or fried foods. This information is not intended to replace advice given to you by your health care provider. Make sure you discuss any questions you have with your health care provider. Document Revised: 10/07/2019 Document Reviewed: 10/07/2019 Elsevier Patient Education  2023 [employer].

## 2022-01-25 NOTE — Telephone Encounter (Signed)
ERROR

## 2022-02-01 ENCOUNTER — Ambulatory Visit (INDEPENDENT_AMBULATORY_CARE_PROVIDER_SITE_OTHER): Payer: Commercial Managed Care - HMO | Admitting: Nurse Practitioner

## 2022-02-01 ENCOUNTER — Encounter: Payer: Self-pay | Admitting: Nurse Practitioner

## 2022-02-01 VITALS — BP 112/74 | HR 92 | Temp 97.2°F | Ht 63.0 in | Wt 169.0 lb

## 2022-02-01 DIAGNOSIS — F411 Generalized anxiety disorder: Secondary | ICD-10-CM | POA: Diagnosis not present

## 2022-02-01 DIAGNOSIS — N920 Excessive and frequent menstruation with regular cycle: Secondary | ICD-10-CM | POA: Diagnosis not present

## 2022-02-01 DIAGNOSIS — F321 Major depressive disorder, single episode, moderate: Secondary | ICD-10-CM

## 2022-02-01 MED ORDER — ESCITALOPRAM OXALATE 10 MG PO TABS: 10.0000 mg | ORAL_TABLET | Freq: Every day | ORAL | 0 refills | Status: DC

## 2022-02-01 MED ORDER — SLYND 4 MG PO TABS: 1.0000 | ORAL_TABLET | Freq: Every day | ORAL | 11 refills | Status: DC

## 2022-02-01 NOTE — Patient Instructions (Addendum)
Begin Lexapro 10 mg daily Recommend return to counseling when able Begin Slynd 4 mg daily for menorrhagia Follow-up in 4 weeks Menorrhagia Menorrhagia is when your monthly periods are heavy or last longer than normal. If you have this condition, bleeding and cramping may make it hard for you to do your daily activities. What are the causes? Common causes of this condition include: Growths in the womb (uterus). These are polyps or fibroids. These growths are not cancer. Problems with two hormones called estrogen and progesterone. One of the ovaries not releasing an egg during one or more months. A problem with the thyroid gland. Having a device for birth control (IUD). Side effects of some medicines, such as NSAIDs or blood thinners. A disorder that stops the blood from clotting normally. What increases the risk? You are more likely to have this condition if you have cancer of the womb. What are the signs or symptoms? Having to change your pad or tampon every 1-2 hours because it is soaked. Needing to use pads and tampons at the same time because of heavy bleeding. Needing to wake up to change your pads or tampons during the night. Passing blood clots larger than 1 inch (2.5 cm) in size. Having bleeding that lasts for more than 7 days. Having symptoms of low iron levels (anemia), such as feeling tired or having shortness of breath. How is this treated? You may not need to be treated for this condition. But if you need treatment, you may be given medicines: To reduce bleeding during your period. These include birth control medicines. To make your blood thick. This slows bleeding. To reduce swelling. Medicines that do this include ibuprofen. That have a hormone called progestin. That make the ovaries stop working for a short time. To treat low iron levels. You will be given iron pills if you have this condition. If medicines do not work, surgery may be done. Surgery may be done  to: Remove a part of the lining of the womb. This lining is called the endometrium. This reduces bleeding during a period. Remove growths in the womb. These may be polyps or fibroids. Remove the entire lining of the womb. Remove the womb entirely. This procedure is called a hysterectomy. Follow these instructions at home: Medicines Take over-the-counter and prescription medicines only as told by your doctor. This includes iron pills. Do not change or switch medicines without asking your doctor. Do not take aspirin or medicines that contain aspirin 1 week before or during your period. Aspirin may make bleeding worse. Managing constipation Iron pills may cause trouble pooping (constipation). To prevent or treat problems when pooping, you may need to: Drink enough fluid to keep your pee (urine) pale yellow. Take over-the-counter or prescription medicines. Eat foods that are high in fiber. These include beans, whole grains, and fresh fruits and vegetables. Limit foods that are high in fat and sugar. These include fried or sweet foods. General instructions If you need to change your pad or tampon more than once every 2 hours, limit your activity until the bleeding stops. Eat healthy meals and foods that are high in iron. Foods that have a lot of iron include: Leafy green vegetables. Meat. Liver. Eggs. Whole-grain breads and cereals. Do not try to lose weight until your heavy bleeding has stopped and you have normal amounts of iron in your blood. If you need to lose weight, work with your doctor. Keep all follow-up visits. Contact a doctor if: You soak through a pad  or tampon every 1 or 2 hours, and this happens every time you have a period. You need to use pads and tampons at the same time because you are bleeding so much. You are taking medicine, and: You feel like you may vomit. You vomit. You have watery poop (diarrhea). You have other problems that may be related to the medicine you  are taking. Get help right away if: You soak through more than a pad or tampon in 1 hour. You pass clots bigger than 1 inch (2.5 cm) wide. You feel short of breath. You feel like your heart is beating too fast. You feel dizzy or you faint. You feel very weak or tired. Summary Menorrhagia is when your menstrual periods are heavy or last longer than normal. You may not need to be treated for this condition. If you need treatment, you may be given medicines or have surgery. Take over-the-counter and prescription medicines only as told by your doctor. This includes iron pills. Get help right away if you soak through more than a pad or tampon in 1 hour or you pass large clots. Also, get help right away if you feel dizzy, short of breath, or very weak or tired. This information is not intended to replace advice given to you by your health care provider. Make sure you discuss any questions you have with your health care provider. Document Revised: 12/10/2019 Document Reviewed: 12/10/2019 Elsevier Patient Education  2023 Elsevier Inc.  Managing Anxiety, Adult After being diagnosed with anxiety, you may be relieved to know why you have felt or behaved a certain way. You may also feel overwhelmed about the treatment ahead and what it will mean for your life. With care and support, you can manage this condition. How to manage lifestyle changes Managing stress and anxiety  Stress is your body's reaction to life changes and events, both good and bad. Most stress will last just a few hours, but stress can be ongoing and can lead to more than just stress. Although stress can play a major role in anxiety, it is not the same as anxiety. Stress is usually caused by something external, such as a deadline, test, or competition. Stress normally passes after the triggering event has ended.  Anxiety is caused by something internal, such as imagining a terrible outcome or worrying that something will go wrong that  will devastate you. Anxiety often does not go away even after the triggering event is over, and it can become long-term (chronic) worry. It is important to understand the differences between stress and anxiety and to manage your stress effectively so that it does not lead to an anxious response. Talk with your health care provider or a counselor to learn more about reducing anxiety and stress. He or she may suggest tension reduction techniques, such as: Music therapy. Spend time creating or listening to music that you enjoy and that inspires you. Mindfulness-based meditation. Practice being aware of your normal breaths while not trying to control your breathing. It can be done while sitting or walking. Centering prayer. This involves focusing on a word, phrase, or sacred image that means something to you and brings you peace. Deep breathing. To do this, expand your stomach and inhale slowly through your nose. Hold your breath for 3-5 seconds. Then exhale slowly, letting your stomach muscles relax. Self-talk. Learn to notice and identify thought patterns that lead to anxiety reactions and change those patterns to thoughts that feel peaceful. Muscle relaxation. Taking time to  tense muscles and then relax them. Choose a tension reduction technique that fits your lifestyle and personality. These techniques take time and practice. Set aside 5-15 minutes a day to do them. Therapists can offer counseling and training in these techniques. The training to help with anxiety may be covered by some insurance plans. Other things you can do to manage stress and anxiety include: Keeping a stress diary. This can help you learn what triggers your reaction and then learn ways to manage your response. Thinking about how you react to certain situations. You may not be able to control everything, but you can control your response. Making time for activities that help you relax and not feeling guilty about spending your time  in this way. Doing visual imagery. This involves imagining or creating mental pictures to help you relax. Practicing yoga. Through yoga poses, you can lower tension and promote relaxation.  Medicines Medicines can help ease symptoms. Medicines for anxiety include: Antidepressant medicines. These are usually prescribed for long-term daily control. Anti-anxiety medicines. These may be added in severe cases, especially when panic attacks occur. Medicines will be prescribed by a health care provider. When used together, medicines, psychotherapy, and tension reduction techniques may be the most effective treatment. Relationships Relationships can play a big part in helping you recover. Try to spend more time connecting with trusted friends and family members. Consider going to couples counseling if you have a partner, taking family education classes, or going to family therapy. Therapy can help you and others better understand your condition. How to recognize changes in your anxiety Everyone responds differently to treatment for anxiety. Recovery from anxiety happens when symptoms decrease and stop interfering with your daily activities at home or work. This may mean that you will start to: Have better concentration and focus. Worry will interfere less in your daily thinking. Sleep better. Be less irritable. Have more energy. Have improved memory. It is also important to recognize when your condition is getting worse. Contact your health care provider if your symptoms interfere with home or work and you feel like your condition is not improving. Follow these instructions at home: Activity Exercise. Adults should do the following: Exercise for at least 150 minutes each week. The exercise should increase your heart rate and make you sweat (moderate-intensity exercise). Strengthening exercises at least twice a week. Get the right amount and quality of sleep. Most adults need 7-9 hours of sleep each  night. Lifestyle  Eat a healthy diet that includes plenty of vegetables, fruits, whole grains, low-fat dairy products, and lean protein. Do not eat a lot of foods that are high in fats, added sugars, or salt (sodium). Make choices that simplify your life. Do not use any products that contain nicotine or tobacco. These products include cigarettes, chewing tobacco, and vaping devices, such as e-cigarettes. If you need help quitting, ask your health care provider. Avoid caffeine, alcohol, and certain over-the-counter cold medicines. These may make you feel worse. Ask your pharmacist which medicines to avoid. General instructions Take over-the-counter and prescription medicines only as told by your health care provider. Keep all follow-up visits. This is important. Where to find support You can get help and support from these sources: Self-help groups. Online and [employer]. A trusted spiritual leader. Couples counseling. Family education classes. Family therapy. Where to find more information You may find that joining a support group helps you deal with your anxiety. The following sources can help you locate counselors or support groups near  you: Mental Health America: www.mentalhealthamerica.net Anxiety and Depression Association of [place] (ADAA): https://www.clark.net/ National Alliance on Mental Illness (NAMI): www.nami.org Contact a health care provider if: You have a hard time staying focused or finishing daily tasks. You spend many hours a day feeling worried about everyday life. You become exhausted by worry. You start to have headaches or frequently feel tense. You develop chronic nausea or diarrhea. Get help right away if: You have a racing heart and shortness of breath. You have thoughts of hurting yourself or others. If you ever feel like you may hurt yourself or others, or have thoughts about taking your own life, get help right away. Go to your nearest emergency  department or: Call your local emergency services (911 in the [place] Call a suicide crisis helpline, such as the [place] at 512-184-7519 or 988 in the [place]. This is open 24 hours a day in the [place] Text the Crisis Text Line at 920-525-8499 (in the [place].). Summary Taking steps to learn and use tension reduction techniques can help calm you and help prevent triggering an anxiety reaction. When used together, medicines, psychotherapy, and tension reduction techniques may be the most effective treatment. Family, friends, and partners can play a big part in supporting you. This information is not intended to replace advice given to you by your health care provider. Make sure you discuss any questions you have with your health care provider. Document Revised: 10/21/2020 Document Reviewed: 07/19/2020 Elsevier Patient Education  [place].

## 2022-02-01 NOTE — Progress Notes (Signed)
Subjective:  Patient ID: Felicia Tucker, female    DOB: [birth year]  Age: 41 y.o. MRN: [id number]  Chief Complaint  Patient presents with   Anxiety   Depression    HPI   Patient presents for 4 week follow up for Anxiety and Depression.Wellbutrin increased anxiety in the past.   Felicia Tucker reports increased heavy menstrual bleeding for the past several months. States family history of ovarian cancer with her mother.   Depression, Follow-up  She  was last seen for this 4 weeks ago. Current treatment includes Trintellix which patient d/c due to it causing her to feel gassy and it interfered with her imitrex.       01/04/2022    1:58 PM  Depression screen PHQ 2/9  Decreased Interest 2  Down, Depressed, Hopeless 2  PHQ - 2 Score 4  Altered sleeping 0  Tired, decreased energy 2  Change in appetite 1  Feeling bad or failure about yourself  3  Trouble concentrating 3  Moving slowly or fidgety/restless 0  Suicidal thoughts 0  PHQ-9 Score 13  Difficult doing work/chores Somewhat difficult      GAD-7 Results    01/04/2022    1:57 PM  GAD-7 Generalized Anxiety Disorder Screening Tool  1. Feeling Nervous, Anxious, or on Edge 3  2. Not Being Able to Stop or Control Worrying 3  3. Worrying Too Much About Different Things 3  4. Trouble Relaxing 3  5. Being So Restless it's Hard To Sit Still 2  6. Becoming Easily Annoyed or Irritable 3  7. Feeling Afraid As If Something Awful Might Happen 1  Total GAD-7 Score 18  Difficulty At Work, Home, or Getting  Along With Others? Somewhat difficult      Current Outpatient Medications on File Prior to Visit  Medication Sig Dispense Refill   clonazePAM (KLONOPIN) 0.5 MG tablet Take 1 tablet (0.5 mg total) by mouth as needed for anxiety. 30 tablet 1   Multiple Vitamins-Minerals (HAIR/SKIN/NAILS) CAPS Take 2 tablets by mouth daily.     omeprazole-sodium bicarbonate (ZEGERID) 40-1100 MG capsule TAKE 1 CAPSULE BY MOUTH EVERY DAY BEFORE  BREAKFAST 90 capsule 4   pantoprazole (PROTONIX) 40 MG tablet Take 1 tablet (40 mg total) by mouth daily. 90 tablet 3   No current facility-administered medications on file prior to visit.   Past Medical History:  Diagnosis Date   Anxiety    GERD (gastroesophageal reflux disease)    Gestational diabetes    History of kidney stones    PONV (postoperative nausea and vomiting)    Renal disorder    Kidney Stones   Past Surgical History:  Procedure Laterality Date   CYSTOSCOPY/URETEROSCOPY/HOLMIUM LASER/STENT PLACEMENT Left 07/26/2021   Procedure: CYSTOSCOPY/URETEROSCOPY/HOLMIUM LASER/STENT PLACEMENT;  Surgeon: Bjorn Pippin, MD;  Location: [facility];  Service: Urology;  Laterality: Left;   DILATION AND CURETTAGE OF UTERUS     DILATION AND CURETTAGE OF UTERUS  2010   EXTRACORPOREAL SHOCK WAVE LITHOTRIPSY Left 02/16/2017   Procedure: LEFT EXTRACORPOREAL SHOCK WAVE LITHOTRIPSY (ESWL);  Surgeon: Debroah Baller, MD;  Location: [facility];  Service: Urology;  Laterality: Left;   LITHOTRIPSY     Multiple lithotripsy   LITHOTRIPSY     multiple- x10   WISDOM TOOTH EXTRACTION      Family History  Problem Relation Age of Onset   Ovarian cancer Mother    Diabetes Mother    Heart disease Father    Colon cancer Neg Hx    Rectal cancer  Neg Hx    Stomach cancer Neg Hx    Social History   Socioeconomic History   Marital status: Married    Spouse name: Laryn Venning   Number of children: 2   Years of education: Not on file   Highest education level: Not on file  Occupational History   Occupation: Self Employed  Tobacco Use   Smoking status: Former    Types: Cigarettes   Smokeless tobacco: Never  Vaping Use   Vaping Use: Never used  Substance and Sexual Activity   Alcohol use: Yes    Comment: Socially   Drug use: No   Sexual activity: Yes    Partners: Male  Other Topics Concern   Not on file  Social History Narrative   ** Merged History Encounter **       Social Determinants of  Health   Financial Resource Strain: Low Risk  (01/04/2022)   Overall Financial Resource Strain (CARDIA)    Difficulty of Paying Living Expenses: Not hard at all  Food Insecurity: No Food Insecurity (01/04/2022)   Hunger Vital Sign    Worried About Running Out of Food in the Last Year: Never true    Ran Out of Food in the Last Year: Never true  Transportation Needs: No Transportation Needs (01/04/2022)   PRAPARE - Hydrologist (Medical): No    Lack of Transportation (Non-Medical): No  Physical Activity: Sufficiently Active (01/04/2022)   Exercise Vital Sign    Days of Exercise per Week: 3 days    Minutes of Exercise per Session: 50 min  Stress: No Stress Concern Present (01/04/2022)   [place]    Feeling of Stress : Not at all  Social Connections: Moderately Integrated (01/04/2022)   Social Connection and Isolation Panel [NHANES]    Frequency of Communication with Friends and Family: More than three times a week    Frequency of Social Gatherings with Friends and Family: More than three times a week    Attends Religious Services: More than 4 times per year    Active Member of [employer] or Organizations: No    Attends Archivist Meetings: Never    Marital Status: Married    Review of Systems  Genitourinary:  Positive for menstrual problem (menorrhagia).   See pertinent positives and negatives per HPI.   Objective:  BP 112/74   Pulse 92   Temp (!) 97.2 F (36.2 C)   Ht 5\' 3"  (1.6 m)   Wt 169 lb (76.7 kg)   LMP 12/11/2021   SpO2 99%   BMI 29.94 kg/m      02/01/2022   11:24 AM 01/04/2022    1:47 PM 09/02/2021   11:04 AM  BP/Weight  Systolic BP [phone]  Diastolic BP 74 64 84  Wt. (Lbs) 169 165 164  BMI 29.94 kg/m2 29.23 kg/m2 29.05 kg/m2    Physical Exam Vitals reviewed.  Constitutional:      Appearance: Normal appearance.  Skin:    General: Skin is warm.      Capillary Refill: Capillary refill takes less than 2 seconds.  Neurological:     General: No focal deficit present.     Mental Status: She is alert and oriented to person, place, and time.  Psychiatric:        Mood and Affect: Mood normal.        Behavior: Behavior normal.  Lab Results  Component Value Date   WBC 11.9 (H) 07/29/2021   HGB 13.0 07/29/2021   HCT 38.2 07/29/2021   PLT 423 (H) 07/29/2021   GLUCOSE 112 (H) 07/29/2021   ALT 19 07/29/2021   AST 15 07/29/2021   NA 139 07/29/2021   K 4.6 07/29/2021   CL 106 07/29/2021   CREATININE 0.75 07/29/2021   BUN 11 07/29/2021   CO2 25 07/29/2021      Assessment & Plan:   1. Menorrhagia with regular cycle - Drospirenone (SLYND) 4 MG TABS; Take 1 tablet by mouth daily.  Dispense: 28 tablet; Refill: 11  2. GAD (generalized anxiety disorder) - escitalopram (LEXAPRO) 10 MG tablet; Take 1 tablet (10 mg total) by mouth daily.  Dispense: 30 tablet; Refill: 0  3. Depression, major, single episode, moderate (HCC) - escitalopram (LEXAPRO) 10 MG tablet; Take 1 tablet (10 mg total) by mouth daily.  Dispense: 30 tablet; Refill: 0      Begin Lexapro 10 mg daily Recommend return to counseling when able Begin Slynd 4 mg daily for menorrhagia Follow-up in 4 weeks  Follow-up: 4-weeks  An After Visit Summary was printed and given to the patient.  I, Janie Morning, NP, have reviewed all documentation for this visit. The documentation on 02/01/22 for the exam, diagnosis, procedures, and orders are all accurate and complete.    Signed, Janie Morning, NP [facility] [phone]

## 2022-02-02 ENCOUNTER — Ambulatory Visit
Admission: RE | Admit: 2022-02-02 | Discharge: 2022-02-02 | Disposition: A | Discharge status: Home / Self Care | Payer: Commercial Managed Care - HMO | Source: Ambulatory Visit | Attending: Nurse Practitioner | Admitting: Nurse Practitioner

## 2022-02-02 DIAGNOSIS — Z1231 Encounter for screening mammogram for malignant neoplasm of breast: Secondary | ICD-10-CM

## 2022-02-08 ENCOUNTER — Telehealth: Payer: Self-pay

## 2022-02-08 NOTE — Telephone Encounter (Signed)
PA submitted and denied via covermymeds for Trintellix "here is nothing to support that the individual has had documented failure, inadequate response, contraindication per FDA label, or intolerance to 3 of the following covered generic alternatives (unless already stabilized on the target drug): Bupropion/XL/SR; Citalopram; Desvenlafaxine/ER; Duloxetine; Escitalopram; Fluoxetine; Fluvoxamine/ER; Mirtazapine/ODT; Paroxetine; Sertraline;  Venlafaxine/ER; Vilazodone. An exception to the criteria will be provided when an individual is not a candidate for (for example, stabilized condition where therapeutic interchange is inappropriate) the step therapy requirements set forth. Fetzima and Trintellix are medically necessary when the patient has documented failure, inadequate response, contraindication per FDA label, or intolerance to 3 of the following covered generic alternatives: Bupropion/XL/SR; Citalopram; Desvenlafaxine/ER; Duloxetine; Escitalopram; Fluoxetine; Fluvoxamine/ER; Mirtazapine/ODT; Paroxetine; Sertraline; Venlafaxine/ER; Vilazodone. An exception to the criteria will be provided when an individual is not a candidate for (for example, stabilized condition where therapeutic interchange is inappropriate) the step therapy  requirements set forth. (Note: receipt of samples does not satisfy criteria requirements for coverage)

## 2022-03-08 ENCOUNTER — Ambulatory Visit: Payer: Commercial Managed Care - HMO | Admitting: Nurse Practitioner

## 2022-03-31 ENCOUNTER — Other Ambulatory Visit: Payer: Self-pay | Admitting: Nurse Practitioner

## 2022-03-31 ENCOUNTER — Ambulatory Visit (INDEPENDENT_AMBULATORY_CARE_PROVIDER_SITE_OTHER): Payer: Commercial Managed Care - HMO | Admitting: Nurse Practitioner

## 2022-03-31 ENCOUNTER — Encounter: Payer: Self-pay | Admitting: Nurse Practitioner

## 2022-03-31 VITALS — BP 128/76 | HR 87 | Temp 97.3°F | Ht 63.0 in | Wt 173.0 lb

## 2022-03-31 DIAGNOSIS — F411 Generalized anxiety disorder: Secondary | ICD-10-CM

## 2022-03-31 DIAGNOSIS — K219 Gastro-esophageal reflux disease without esophagitis: Secondary | ICD-10-CM

## 2022-03-31 DIAGNOSIS — N921 Excessive and frequent menstruation with irregular cycle: Secondary | ICD-10-CM | POA: Diagnosis not present

## 2022-03-31 DIAGNOSIS — F321 Major depressive disorder, single episode, moderate: Secondary | ICD-10-CM

## 2022-03-31 DIAGNOSIS — E559 Vitamin D deficiency, unspecified: Secondary | ICD-10-CM

## 2022-03-31 MED ORDER — NORETHIN ACE-ETH ESTRAD-FE 1-20 MG-MCG PO TABS: 1.0000 | ORAL_TABLET | Freq: Every day | ORAL | 11 refills | Status: DC

## 2022-03-31 MED ORDER — ESCITALOPRAM OXALATE 10 MG PO TABS: 10.0000 mg | ORAL_TABLET | Freq: Every day | ORAL | 1 refills | Status: DC

## 2022-03-31 MED ORDER — DEXLANSOPRAZOLE 60 MG PO CPDR: 60.0000 mg | DELAYED_RELEASE_CAPSULE | Freq: Every day | ORAL | 1 refills | Status: DC

## 2022-03-31 NOTE — Patient Instructions (Addendum)
Begin Lo-estrin Fe daily for abnormal vaginal bleeding Continue Lexapro 10 mg daily Begin Dexilant 30 mg daily for GERD Follow-up in 49-months  Preventing Vitamin D Deficiency Vitamin D deficiency is when your body does not have enough vitamin D. Vitamin D is important because it helps your body maintain calcium and phosphorus levels. It plays a key role in the health of bones and teeth, reduces inflammation, and improves the body's defense system (immune system). Our bodies make vitamin D when our skin is exposed to direct sunlight. However, for many people, this may not be enough vitamin D to meet the body's needs. How can this condition affect me? If vitamin D deficiency is severe, it can cause a condition in which a person's bones become softer than normal. In adults, this condition is called osteomalacia. In children, this condition is called rickets. Vitamin D deficiency can also cause weak or thin bones (osteoporosis) in adults. What can increase my risk? You may be at risk for a vitamin D deficiency if you: Are pregnant. Are obese. Are an older adult. Have dark skin. Take certain medicines that affect the way vitamin D is absorbed. Have had a surgery in which a part of the stomach or a part of the small intestine was removed. Other risk factors include: Having a condition that limits your ability to absorb fat, such as Crohn's disease, long-term (chronic) pancreatitis, or cystic fibrosis. Having certain conditions that are passed from parent to child (inherited). Not having access to foods rich in vitamin D. Having limited ability to move and go outside safely. Living in areas that have fewer hours of sunlight. Spending most of your day indoors, or covering your skin all the time when you are outdoors. What actions can I take to reduce my risk of a vitamin D deficiency? Knowing the best sources of vitamin D You can meet your daily vitamin D needs from: Foods. Dietary  supplements. Direct exposure to natural sunlight. Infant formula, for infants. Knowing how much vitamin D you need General recommendations for daily vitamin D intake vary by these categories: Infants: 400 international units (IU). Children older than 1 year: 600 international units. Adults: 600 international units. Pregnant and breastfeeding women: 600 international units. Adults older than 70 years: 800 international units. These are minimum levels of recommended amounts. Your health care provider may recommend a different amount of vitamin D intake based on your specific needs and your overall health. Getting sun exposure Get regular, safe exposure to natural sunlight. Expose your skin to direct sunlight for at least 15 minutes every day. If you have dark skin, you may need to expose your skin for a longer period of time. Protect your skin from too much sun exposure. This helps to prevent skin cancer. Ask your health care provider if regular sun exposure is safe for you. Do not use a tanning bed. Eating and drinking  Eat foods that naturally contain vitamin D. These include: Beef liver. Eggs. The vitamin D is in the yolk. Fish, such as salmon or trout. Mushrooms that were treated with UV light. Eat or drink products that have vitamin D added to them (are fortified). These may include: Cereals. Milk, including plant-based alternatives such as almond, soy, or oat milks. Orange juice. Margarine. When choosing foods, check the food label on the package to see: How much vitamin D is in the item. If the food is fortified with vitamin D. The items listed above may not be a complete list of foods  and beverages you can eat and drink. Contact a dietitian for more information. Taking supplements and medicines If you are at risk for vitamin D deficiency, or if you have certain diseases, your health care provider may recommend that you take a vitamin D supplement. Make sure you: Talk with your  health care provider before you start taking any vitamin D supplements. You may be more sensitive to the side effects of vitamin D supplements if you are on certain medicines or have certain medical conditions. Tell your health care provider about all medicines you are taking, including vitamins, herbs, eye drops, creams, and over-the-counter medicines. Take over-the-counter and prescription medicines only as told by your health care provider. Take supplements only as told by your health care provider. To increase absorption of your supplement, take it with a meal or snack. Summary Vitamin D plays a key role in the health of bones and teeth, reduces inflammation, and improves the body's defense system (immune system). A vitamin D deficiency can put you at risk of developing conditions such as rickets or osteoporosis. Our bodies make vitamin D when our skin is exposed to direct sunlight. However, for many people, this may not be enough vitamin D to meet the body's needs. Some foods naturally contain vitamin D, including beef liver, egg yolk, and fish. Eat or drink products that have vitamin D added to them (are fortified). This information is not intended to replace advice given to you by your health care provider. Make sure you discuss any questions you have with your health care provider. Document Revised: 01/01/2021 Document Reviewed: 01/01/2021 Elsevier Patient Education  2023 Elsevier Inc.    Menorrhagia Menorrhagia is when your monthly periods are heavy or last longer than normal. If you have this condition, bleeding and cramping may make it hard for you to do your daily activities. What are the causes? Common causes of this condition include: Growths in the womb (uterus). These are polyps or fibroids. These growths are not cancer. Problems with two hormones called estrogen and progesterone. One of the ovaries not releasing an egg during one or more months. A problem with the thyroid  gland. Having a device for birth control (IUD). Side effects of some medicines, such as NSAIDs or blood thinners. A disorder that stops the blood from clotting normally. What increases the risk? You are more likely to have this condition if you have cancer of the womb. What are the signs or symptoms? Having to change your pad or tampon every 1-2 hours because it is soaked. Needing to use pads and tampons at the same time because of heavy bleeding. Needing to wake up to change your pads or tampons during the night. Passing blood clots larger than 1 inch (2.5 cm) in size. Having bleeding that lasts for more than 7 days. Having symptoms of low iron levels (anemia), such as feeling tired or having shortness of breath. How is this treated? You may not need to be treated for this condition. But if you need treatment, you may be given medicines: To reduce bleeding during your period. These include birth control medicines. To make your blood thick. This slows bleeding. To reduce swelling. Medicines that do this include ibuprofen. That have a hormone called progestin. That make the ovaries stop working for a short time. To treat low iron levels. You will be given iron pills if you have this condition. If medicines do not work, surgery may be done. Surgery may be done to: Remove a part  of the lining of the womb. This lining is called the endometrium. This reduces bleeding during a period. Remove growths in the womb. These may be polyps or fibroids. Remove the entire lining of the womb. Remove the womb entirely. This procedure is called a hysterectomy. Follow these instructions at home: Medicines Take over-the-counter and prescription medicines only as told by your doctor. This includes iron pills. Do not change or switch medicines without asking your doctor. Do not take aspirin or medicines that contain aspirin 1 week before or during your period. Aspirin may make bleeding worse. Managing  constipation Iron pills may cause trouble pooping (constipation). To prevent or treat problems when pooping, you may need to: Drink enough fluid to keep your pee (urine) pale yellow. Take over-the-counter or prescription medicines. Eat foods that are high in fiber. These include beans, whole grains, and fresh fruits and vegetables. Limit foods that are high in fat and sugar. These include fried or sweet foods. General instructions If you need to change your pad or tampon more than once every 2 hours, limit your activity until the bleeding stops. Eat healthy meals and foods that are high in iron. Foods that have a lot of iron include: Leafy green vegetables. Meat. Liver. Eggs. Whole-grain breads and cereals. Do not try to lose weight until your heavy bleeding has stopped and you have normal amounts of iron in your blood. If you need to lose weight, work with your doctor. Keep all follow-up visits. Contact a doctor if: You soak through a pad or tampon every 1 or 2 hours, and this happens every time you have a period. You need to use pads and tampons at the same time because you are bleeding so much. You are taking medicine, and: You feel like you may vomit. You vomit. You have watery poop (diarrhea). You have other problems that may be related to the medicine you are taking. Get help right away if: You soak through more than a pad or tampon in 1 hour. You pass clots bigger than 1 inch (2.5 cm) wide. You feel short of breath. You feel like your heart is beating too fast. You feel dizzy or you faint. You feel very weak or tired. Summary Menorrhagia is when your menstrual periods are heavy or last longer than normal. You may not need to be treated for this condition. If you need treatment, you may be given medicines or have surgery. Take over-the-counter and prescription medicines only as told by your doctor. This includes iron pills. Get help right away if you soak through more than a  pad or tampon in 1 hour or you pass large clots. Also, get help right away if you feel dizzy, short of breath, or very weak or tired. This information is not intended to replace advice given to you by your health care provider. Make sure you discuss any questions you have with your health care provider. Document Revised: 12/10/2019 Document Reviewed: 12/10/2019 Elsevier Patient Education  2023 Elsevier Inc.   Food Choices for Gastroesophageal Reflux Disease, Adult When you have gastroesophageal reflux disease (GERD), the foods you eat and your eating habits are very important. Choosing the right foods can help ease your discomfort. Think about working with a food expert (dietitian) to help you make good choices. What are tips for following this plan? Reading food labels Look for foods that are low in saturated fat. Foods that may help with your symptoms include: Foods that have less than 5% of daily value (  DV) of fat. Foods that have 0 grams of trans fat. Cooking Do not fry your food. Cook your food by baking, steaming, grilling, or broiling. These are all methods that do not need a lot of fat for cooking. To add flavor, try to use herbs that are low in spice and acidity. Meal planning  Choose healthy foods that are low in fat, such as: Fruits and vegetables. Whole grains. Low-fat dairy products. Lean meats, fish, and poultry. Eat small meals often instead of eating 3 large meals each day. Eat your meals slowly in a place where you are relaxed. Avoid bending over or lying down until 2-3 hours after eating. Limit high-fat foods such as fatty meats or fried foods. Limit your intake of fatty foods, such as oils, butter, and shortening. Avoid the following as told by your doctor: Foods that cause symptoms. These may be different for different people. Keep a food diary to keep track of foods that cause symptoms. Alcohol. Drinking a lot of liquid with meals. Eating meals during the 2-3  hours before bed. Lifestyle Stay at a healthy weight. Ask your doctor what weight is healthy for you. If you need to lose weight, work with your doctor to do so safely. Exercise for at least 30 minutes on 5 or more days each week, or as told by your doctor. Wear loose-fitting clothes. Do not smoke or use any products that contain nicotine or tobacco. If you need help quitting, ask your doctor. Sleep with the head of your bed higher than your feet. Use a wedge under the mattress or blocks under the bed frame to raise the head of the bed. Chew sugar-free gum after meals. What foods should eat?  Eat a healthy, well-balanced diet of fruits, vegetables, whole grains, low-fat dairy products, lean meats, fish, and poultry. Each person is different. Foods that may cause symptoms in one person may not cause any symptoms in another person. Work with your doctor to find foods that are safe for you. The items listed above may not be a complete list of what you can eat and drink. Contact a food expert for more options. What foods should I avoid? Limiting some of these foods may help in managing the symptoms of GERD. Everyone is different. Talk with a food expert or your doctor to help you find the exact foods to avoid, if any. Fruits Any fruits prepared with added fat. Any fruits that cause symptoms. For some people, this may include citrus fruits, such as oranges, grapefruit, pineapple, and lemons. Vegetables Deep-fried vegetables. [place] fries. Any vegetables prepared with added fat. Any vegetables that cause symptoms. For some people, this may include tomatoes and tomato products, chili peppers, onions and garlic, and horseradish. Grains Pastries or quick breads with added fat. Meats and other proteins High-fat meats, such as fatty beef or pork, hot dogs, ribs, ham, sausage, salami, and bacon. Fried meat or protein, including fried fish and fried chicken. Nuts and nut butters, in large  amounts. Dairy Whole milk and chocolate milk. Sour cream. Cream. Ice cream. Cream cheese. Milkshakes. Fats and oils Butter. Margarine. Shortening. Ghee. Beverages Coffee and tea, with or without caffeine. Carbonated beverages. Sodas. Energy drinks. Fruit juice made with acidic fruits, such as orange or grapefruit. Tomato juice. Alcoholic drinks. Sweets and desserts Chocolate and cocoa. Donuts. Seasonings and condiments Pepper. Peppermint and spearmint. Added salt. Any condiments, herbs, or seasonings that cause symptoms. For some people, this may include curry, hot sauce, or vinegar-based salad  dressings. The items listed above may not be a complete list of what you should not eat and drink. Contact a food expert for more options. Questions to ask your doctor Diet and lifestyle changes are often the first steps that are taken to manage symptoms of GERD. If diet and lifestyle changes do not help, talk with your doctor about taking medicines. Where to find more information International Foundation for Gastrointestinal Disorders: aboutgerd.org Summary When you have GERD, food and lifestyle choices are very important in easing your symptoms. Eat small meals often instead of 3 large meals a day. Eat your meals slowly and in a place where you are relaxed. Avoid bending over or lying down until 2-3 hours after eating. Limit high-fat foods such as fatty meats or fried foods. This information is not intended to replace advice given to you by your health care provider. Make sure you discuss any questions you have with your health care provider. Document Revised: 10/07/2019 Document Reviewed: 10/07/2019 Elsevier Patient Education  2023 [employer].

## 2022-03-31 NOTE — Progress Notes (Signed)
Subjective:  Patient ID: Felicia Tucker, female    DOB: 1981/03/29  Age: 41 y.o. MRN: [id number]  Chief Complaint  Patient presents with  . Depression    HPI: Pt presents for follow-up of depression with anxiety, GERD, and menorrhagia with irregular cycle. She tells me she has been adherent to Slynd progesterone only contraceptive. Side effects have included irregular menstrual cycle. States she has been bleeding for several days consecutively, stopping 2-3 days, then return to bleeding.    Depression, Follow-up  She  was last seen for this 8 weeks ago. Current treatment includes Lexapro. --Started taking 2 wks ago. She is not currently in counseling, plans to resume in Jan 2024 when her insurance coverage changes.    She reports excellent compliance with treatment. She is not having side effects.   She reports excellent tolerance of treatment. Current symptoms include: difficulty concentrating and fatigue She feels she is Improved since last visit.  Anxiety, Follow-up  She was last seen for anxiety 8 weeks ago. Current treatment includes Lexapro 10 mg QD  GAD-7 Results    01/04/2022    1:57 PM  GAD-7 Generalized Anxiety Disorder Screening Tool  1. Feeling Nervous, Anxious, or on Edge 3  2. Not Being Able to Stop or Control Worrying 3  3. Worrying Too Much About Different Things 3  4. Trouble Relaxing 3  5. Being So Restless it's Hard To Sit Still 2  6. Becoming Easily Annoyed or Irritable 3  7. Feeling Afraid As If Something Awful Might Happen 1  Total GAD-7 Score 18  Difficulty At Work, Home, or Getting  Along With Others? Somewhat difficult    PHQ-9 Scores    01/04/2022    1:58 PM  PHQ9 SCORE ONLY  PHQ-9 Total Score 13      Current Outpatient Medications on File Prior to Visit  Medication Sig Dispense Refill  . clonazePAM (KLONOPIN) 0.5 MG tablet Take 1 tablet (0.5 mg total) by mouth as needed for anxiety. 30 tablet 1  . Drospirenone (SLYND) 4 MG TABS Take 1  tablet by mouth daily. 28 tablet 11  . escitalopram (LEXAPRO) 10 MG tablet Take 1 tablet (10 mg total) by mouth daily. 30 tablet 0  . Multiple Vitamins-Minerals (HAIR/SKIN/NAILS) CAPS Take 2 tablets by mouth daily.    Marland Kitchen omeprazole-sodium bicarbonate (ZEGERID) 40-1100 MG capsule TAKE 1 CAPSULE BY MOUTH EVERY DAY BEFORE BREAKFAST 90 capsule 4   No current facility-administered medications on file prior to visit.   Past Medical History:  Diagnosis Date  . Anxiety   . GERD (gastroesophageal reflux disease)   . Gestational diabetes   . History of kidney stones   . PONV (postoperative nausea and vomiting)   . Renal disorder    Kidney Stones   Past Surgical History:  Procedure Laterality Date  . CYSTOSCOPY/URETEROSCOPY/HOLMIUM LASER/STENT PLACEMENT Left 07/26/2021   Procedure: CYSTOSCOPY/URETEROSCOPY/HOLMIUM LASER/STENT PLACEMENT;  Surgeon: Bjorn Pippin, MD;  Location: [facility];  Service: Urology;  Laterality: Left;  . DILATION AND CURETTAGE OF UTERUS    . DILATION AND CURETTAGE OF UTERUS  2010  . EXTRACORPOREAL SHOCK WAVE LITHOTRIPSY Left 02/16/2017   Procedure: LEFT EXTRACORPOREAL SHOCK WAVE LITHOTRIPSY (ESWL);  Surgeon: Debroah Baller, MD;  Location: [facility];  Service: Urology;  Laterality: Left;  . LITHOTRIPSY     Multiple lithotripsy  . LITHOTRIPSY     multiple- x10  . WISDOM TOOTH EXTRACTION      Family History  Problem Relation Age of Onset  .  Ovarian cancer Mother   . Diabetes Mother   . Heart disease Father   . Breast cancer Other   . Colon cancer Neg Hx   . Rectal cancer Neg Hx   . Stomach cancer Neg Hx    Social History   Socioeconomic History  . Marital status: Married    Spouse name: Vannessa Godown  . Number of children: 2  . Years of education: Not on file  . Highest education level: Not on file  Occupational History  . Occupation: Self Employed  Tobacco Use  . Smoking status: Former    Types: Cigarettes  . Smokeless tobacco: Never  Vaping Use  . Vaping Use:  Never used  Substance and Sexual Activity  . Alcohol use: Yes    Comment: Socially  . Drug use: No  . Sexual activity: Yes    Partners: Male  Other Topics Concern  . Not on file  Social History Narrative   ** Merged History Encounter **       Social Determinants of Health   Financial Resource Strain: Low Risk  (01/04/2022)   Overall Financial Resource Strain (CARDIA)   . Difficulty of Paying Living Expenses: Not hard at all  Food Insecurity: No Food Insecurity (01/04/2022)   Hunger Vital Sign   . Worried About Programme researcher, broadcasting/film/video in the Last Year: Never true   . Ran Out of Food in the Last Year: Never true  Transportation Needs: No Transportation Needs (01/04/2022)   PRAPARE - Transportation   . Lack of Transportation (Medical): No   . Lack of Transportation (Non-Medical): No  Physical Activity: Sufficiently Active (01/04/2022)   Exercise Vital Sign   . Days of Exercise per Week: 3 days   . Minutes of Exercise per Session: 50 min  Stress: No Stress Concern Present (01/04/2022)   [employer] of Occupational Health - Occupational Stress Questionnaire   . Feeling of Stress : Not at all  Social Connections: Moderately Integrated (01/04/2022)   Social Connection and Isolation Panel [NHANES]   . Frequency of Communication with Friends and Family: More than three times a week   . Frequency of Social Gatherings with Friends and Family: More than three times a week   . Attends Religious Services: More than 4 times per year   . Active Member of Clubs or Organizations: No   . Attends Banker Meetings: Never   . Marital Status: Married    Review of Systems  Constitutional:  Negative for chills, fatigue and fever.  HENT:  Negative for congestion, ear pain, rhinorrhea and sore throat.   Respiratory:  Negative for cough and shortness of breath.   Cardiovascular:  Negative for chest pain.  Gastrointestinal:  Negative for abdominal pain, constipation, diarrhea, nausea and  vomiting.  Genitourinary:  Negative for dysuria and urgency.  Musculoskeletal:  Negative for back pain and myalgias.  Neurological:  Negative for dizziness, weakness, light-headedness and headaches.  Psychiatric/Behavioral:  Positive for depression. Negative for dysphoric mood. The patient is not nervous/anxious.      Objective:  BP 128/76   Pulse 87   Temp (!) 97.3 F (36.3 C)   Ht 5\' 3"  (1.6 m)   Wt 173 lb (78.5 kg)   SpO2 99%   BMI 30.65 kg/m       03/31/2022    1:33 PM 02/01/2022   11:24 AM 01/04/2022    1:47 PM  BP/Weight  Systolic BP  112 01/06/2022  Diastolic BP  74  64  Wt. (Lbs) 173 169 165  BMI 30.65 kg/m2 29.94 kg/m2 29.23 kg/m2    Physical Exam Vitals reviewed.  Constitutional:      Appearance: Normal appearance.  Skin:    General: Skin is warm and dry.     Capillary Refill: Capillary refill takes less than 2 seconds.  Neurological:     Mental Status: She is alert and oriented to person, place, and time.  Psychiatric:        Mood and Affect: Mood normal.        Behavior: Behavior normal.       Lab Results  Component Value Date   WBC 11.9 (H) 07/29/2021   HGB 13.0 07/29/2021   HCT 38.2 07/29/2021   PLT 423 (H) 07/29/2021   GLUCOSE 112 (H) 07/29/2021   ALT 19 07/29/2021   AST 15 07/29/2021   NA 139 07/29/2021   K 4.6 07/29/2021   CL 106 07/29/2021   CREATININE 0.75 07/29/2021   BUN 11 07/29/2021   CO2 25 07/29/2021      Assessment & Plan:   Problem List Items Addressed This Visit   None .  No orders of the defined types were placed in this encounter.   No orders of the defined types were placed in this encounter.    Follow-up: No follow-ups on file.  An After Visit Summary was printed and given to the patient.  Janie Morning, NP [facility] [phone]

## 2022-04-01 LAB — TSH: TSH: 0.832 u[IU]/mL (ref 0.450–4.500)

## 2022-04-01 LAB — CBC WITH DIFFERENTIAL/PLATELET
Basophils Absolute: 0 10*3/uL (ref 0.0–0.2)
Basos: 0 %
EOS (ABSOLUTE): 0.1 10*3/uL (ref 0.0–0.4)
Eos: 1 %
Hematocrit: 35.8 % (ref 34.0–46.6)
Hemoglobin: 11.7 g/dL (ref 11.1–15.9)
Immature Grans (Abs): 0 10*3/uL (ref 0.0–0.1)
Immature Granulocytes: 0 %
Lymphocytes Absolute: 2.8 10*3/uL (ref 0.7–3.1)
Lymphs: 31 %
MCH: 27.7 pg (ref 26.6–33.0)
MCHC: 32.7 g/dL (ref 31.5–35.7)
MCV: 85 fL (ref 79–97)
Monocytes Absolute: 0.6 10*3/uL (ref 0.1–0.9)
Monocytes: 6 %
Neutrophils Absolute: 5.4 10*3/uL (ref 1.4–7.0)
Neutrophils: 62 %
Platelets: 422 10*3/uL (ref 150–450)
RBC: 4.22 x10E6/uL (ref 3.77–5.28)
RDW: 13.8 % (ref 11.7–15.4)
WBC: 9 10*3/uL (ref 3.4–10.8)

## 2022-04-01 LAB — COMPREHENSIVE METABOLIC PANEL
ALT: 17 IU/L (ref 0–32)
AST: 14 IU/L (ref 0–40)
Albumin/Globulin Ratio: 1.9 (ref 1.2–2.2)
Albumin: 4.3 g/dL (ref 3.9–4.9)
Alkaline Phosphatase: 47 IU/L (ref 44–121)
BUN/Creatinine Ratio: 15 (ref 9–23)
BUN: 12 mg/dL (ref 6–24)
Bilirubin Total: <0.2 mg/dL (ref 0.0–1.2)
CO2: 23 mmol/L (ref 20–29)
Calcium: 9.5 mg/dL (ref 8.7–10.2)
Chloride: 103 mmol/L (ref 96–106)
Creatinine, Ser: 0.82 mg/dL (ref 0.57–1.00)
Globulin, Total: 2.3 g/dL (ref 1.5–4.5)
Glucose: 123 mg/dL — ABNORMAL HIGH (ref 70–99)
Potassium: 4.5 mmol/L (ref 3.5–5.2)
Sodium: 139 mmol/L (ref 134–144)
Total Protein: 6.6 g/dL (ref 6.0–8.5)
eGFR: 91 mL/min/{1.73_m2} (ref >59)

## 2022-04-01 LAB — VITAMIN D 25 HYDROXY (VIT D DEFICIENCY, FRACTURES): Vit D, 25-Hydroxy: 42.1 ng/mL (ref 30.0–100.0)

## 2022-04-01 LAB — T4, FREE: Free T4: 1.05 ng/dL (ref 0.82–1.77)

## 2022-05-04 ENCOUNTER — Other Ambulatory Visit: Payer: Self-pay | Admitting: Nurse Practitioner

## 2022-05-04 DIAGNOSIS — K219 Gastro-esophageal reflux disease without esophagitis: Secondary | ICD-10-CM

## 2022-05-05 ENCOUNTER — Other Ambulatory Visit: Payer: Self-pay

## 2022-05-05 DIAGNOSIS — F411 Generalized anxiety disorder: Secondary | ICD-10-CM

## 2022-05-05 DIAGNOSIS — F321 Major depressive disorder, single episode, moderate: Secondary | ICD-10-CM

## 2022-05-05 NOTE — Telephone Encounter (Signed)
Felicia Tucker called back to confirm that she has been taking dexilant with excellent results.  The protonix was ineffective.  Pleas send prescription to [facility] in [place].

## 2022-05-06 MED ORDER — ESCITALOPRAM OXALATE 10 MG PO TABS: 10.0000 mg | ORAL_TABLET | Freq: Every day | ORAL | 1 refills | Status: DC

## 2022-09-30 ENCOUNTER — Ambulatory Visit: Payer: 59 | Admitting: Physician Assistant

## 2022-09-30 ENCOUNTER — Encounter: Payer: Self-pay | Admitting: Physician Assistant

## 2022-09-30 VITALS — BP 112/70 | HR 89 | Temp 97.3°F | Ht 62.5 in | Wt 171.0 lb

## 2022-09-30 DIAGNOSIS — F419 Anxiety disorder, unspecified: Secondary | ICD-10-CM

## 2022-09-30 DIAGNOSIS — N921 Excessive and frequent menstruation with irregular cycle: Secondary | ICD-10-CM

## 2022-09-30 DIAGNOSIS — F411 Generalized anxiety disorder: Secondary | ICD-10-CM | POA: Insufficient documentation

## 2022-09-30 DIAGNOSIS — Z23 Encounter for immunization: Secondary | ICD-10-CM | POA: Diagnosis not present

## 2022-09-30 DIAGNOSIS — F321 Major depressive disorder, single episode, moderate: Secondary | ICD-10-CM | POA: Diagnosis not present

## 2022-09-30 DIAGNOSIS — Z833 Family history of diabetes mellitus: Secondary | ICD-10-CM

## 2022-09-30 DIAGNOSIS — K219 Gastro-esophageal reflux disease without esophagitis: Secondary | ICD-10-CM | POA: Diagnosis not present

## 2022-09-30 DIAGNOSIS — Z8669 Personal history of other diseases of the nervous system and sense organs: Secondary | ICD-10-CM | POA: Insufficient documentation

## 2022-09-30 MED ORDER — ESCITALOPRAM OXALATE 10 MG PO TABS
10.0000 mg | ORAL_TABLET | Freq: Every day | ORAL | 1 refills | Status: DC
Start: 2022-09-30 — End: 2023-03-31

## 2022-09-30 MED ORDER — SUMATRIPTAN SUCCINATE 25 MG PO TABS: ORAL_TABLET | ORAL | 3 refills | Status: DC

## 2022-09-30 MED ORDER — DEXLANSOPRAZOLE 60 MG PO CPDR: 60.0000 mg | DELAYED_RELEASE_CAPSULE | Freq: Every day | ORAL | 1 refills | Status: DC

## 2022-09-30 MED ORDER — CLONAZEPAM 0.5 MG PO TABS: 0.5000 mg | ORAL_TABLET | ORAL | 1 refills | Status: DC | PRN

## 2022-09-30 NOTE — Assessment & Plan Note (Signed)
Increase in HA could be from stress/sinus congestion/Lexapro Patient will continue to monitor frequency and let us know so we can adjust medications as needed

## 2022-09-30 NOTE — Assessment & Plan Note (Signed)
Labs drawn at a future date due to patient eating about 1 hour ago Continues working on diet and exercise Will add medication as needed depending on labs

## 2022-09-30 NOTE — Assessment & Plan Note (Signed)
Controlled Continue current medications with Clonazepam .5mg  as directed Denies any major side effects or issues taking the medication

## 2022-09-30 NOTE — Assessment & Plan Note (Signed)
Administered in office No major side effects noted

## 2022-09-30 NOTE — Assessment & Plan Note (Signed)
Controlled Denies any major side effects or issues taking the medication Continue taking medication as directed

## 2022-09-30 NOTE — Assessment & Plan Note (Signed)
Controlled The current medical regimen is effective;  continue present plan and medications Lexapro 10mg  Will inform us if she continues to have multiple HA as this is a side effect.  Will adjust medication as needed depending on side effects.

## 2022-09-30 NOTE — Progress Notes (Signed)
Subjective:  Patient ID: Felicia Tucker, female    DOB: 14-Mar-1981  Age: 42 y.o. MRN: [id number]  Chief Complaint  Patient presents with   Medical Management of Chronic Issues    HPI Pt presents for follow-up of depression with anxiety, GERD. Patient had a life insurance policy physical in January of this year and they told her that her A1C was up to 6.4. She has tried to monitor her diet and exercise to loose weight and hopefully lower that. She has a strong family history of DM II with multiple immediate family members diagnosed.   Anxiety and depression are currently very well controlled. States her medications have been able to help her drastically and she is feeling really good as of late. She has had an increase in HA but states she has had an increase in stress with her job so she is accumulating it to that.   Patient recently started Zepbound to help with her weight loss. She gets it through a private company that specializes in [employer]. She has been on it for a little over one month and has lost 9lbs. Denies any other major side effects with the medication.        09/30/2022   10:39 AM 01/04/2022    1:58 PM  Depression screen PHQ 2/9  Decreased Interest 0 2  Down, Depressed, Hopeless 0 2  PHQ - 2 Score 0 4  Altered sleeping 0 0  Tired, decreased energy 0 2  Change in appetite 0 1  Feeling bad or failure about yourself  0 3  Trouble concentrating 0 3  Moving slowly or fidgety/restless 0 0  Suicidal thoughts 0 0  PHQ-9 Score 0 13  Difficult doing work/chores Not difficult at all Somewhat difficult        09/30/2022   10:38 AM  Fall Risk   Falls in the past year? 0  Number falls in past yr: 0  Injury with Fall? 0  Risk for fall due to : No Fall Risks  Follow up Falls evaluation completed    Patient Care Team: Langley Gauss, [place] as PCP - General (Physician Assistant) Cox, Kirsten, MD (Family Medicine)   Review of Systems  Constitutional:  Negative for  fatigue.  HENT:  Negative for congestion, ear pain and sore throat.   Respiratory:  Negative for cough and shortness of breath.   Cardiovascular:  Negative for chest pain.  Gastrointestinal:  Negative for abdominal pain, constipation, diarrhea, nausea and vomiting.  Genitourinary:  Negative for dysuria, frequency and urgency.  Musculoskeletal:  Negative for arthralgias, back pain and myalgias.  Neurological:  Positive for headaches. Negative for dizziness.  Psychiatric/Behavioral:  Negative for agitation and sleep disturbance. The patient is not nervous/anxious.     Current Outpatient Medications on File Prior to Visit  Medication Sig Dispense Refill   Multiple Vitamins-Minerals (HAIR/SKIN/NAILS) CAPS Take 2 tablets by mouth daily.     omeprazole-sodium bicarbonate (ZEGERID) 40-1100 MG capsule Take 1 capsule by mouth daily before breakfast.     tirzepatide (ZEPBOUND) 5 MG/0.5ML Pen Inject 5 mg into the skin once a week.     No current facility-administered medications on file prior to visit.   Past Medical History:  Diagnosis Date   Anxiety    GERD (gastroesophageal reflux disease)    Gestational diabetes    History of kidney stones    PONV (postoperative nausea and vomiting)    Renal disorder    Kidney Stones   Past Surgical  History:  Procedure Laterality Date   CYSTOSCOPY/URETEROSCOPY/HOLMIUM LASER/STENT PLACEMENT Left 07/26/2021   Procedure: CYSTOSCOPY/URETEROSCOPY/HOLMIUM LASER/STENT PLACEMENT;  Surgeon: Bjorn Pippin, MD;  Location: [facility];  Service: Urology;  Laterality: Left;   DILATION AND CURETTAGE OF UTERUS     DILATION AND CURETTAGE OF UTERUS  2010   EXTRACORPOREAL SHOCK WAVE LITHOTRIPSY Left 02/16/2017   Procedure: LEFT EXTRACORPOREAL SHOCK WAVE LITHOTRIPSY (ESWL);  Surgeon: Debroah Baller, MD;  Location: [facility];  Service: Urology;  Laterality: Left;   LITHOTRIPSY     Multiple lithotripsy   LITHOTRIPSY     multiple- x10   WISDOM TOOTH EXTRACTION      Family History   Problem Relation Age of Onset   Ovarian cancer Mother    Diabetes Mother    Heart disease Father    Breast cancer Other    Colon cancer Neg Hx    Rectal cancer Neg Hx    Stomach cancer Neg Hx    Social History   Socioeconomic History   Marital status: Married    Spouse name: Juelle Dickmann   Number of children: 2   Years of education: Not on file   Highest education level: Not on file  Occupational History   Occupation: Self Employed  Tobacco Use   Smoking status: Former    Types: Cigarettes   Smokeless tobacco: Never  Vaping Use   Vaping Use: Never used  Substance and Sexual Activity   Alcohol use: Yes    Comment: Socially   Drug use: No   Sexual activity: Yes    Partners: Male  Other Topics Concern   Not on file  Social History Narrative   ** Merged History Encounter **       Social Determinants of Health   Financial Resource Strain: Low Risk  (01/04/2022)   Overall Financial Resource Strain (CARDIA)    Difficulty of Paying Living Expenses: Not hard at all  Food Insecurity: No Food Insecurity (01/04/2022)   Hunger Vital Sign    Worried About Running Out of Food in the Last Year: Never true    Ran Out of Food in the Last Year: Never true  Transportation Needs: No Transportation Needs (01/04/2022)   PRAPARE - Administrator, Civil Service (Medical): No    Lack of Transportation (Non-Medical): No  Physical Activity: Sufficiently Active (01/04/2022)   Exercise Vital Sign    Days of Exercise per Week: 3 days    Minutes of Exercise per Session: 50 min  Stress: No Stress Concern Present (01/04/2022)   [employer] of Occupational Health - Occupational Stress Questionnaire    Feeling of Stress : Not at all  Social Connections: Moderately Integrated (01/04/2022)   Social Connection and Isolation Panel [NHANES]    Frequency of Communication with Friends and Family: More than three times a week    Frequency of Social Gatherings with Friends and Family:  More than three times a week    Attends Religious Services: More than 4 times per year    Active Member of [employer] or Organizations: No    Attends Engineer, structural: Never    Marital Status: Married    Objective:  BP 112/70 (BP Location: Right Arm, Patient Position: Sitting, Cuff Size: Normal)   Pulse 89   Temp (!) 97.3 F (36.3 C) (Temporal)   Ht 5' 2.5" (1.588 m)   Wt 171 lb (77.6 kg)   SpO2 99%   BMI 30.78 kg/m      09/30/2022  10:37 AM 03/31/2022    1:33 PM 02/01/2022   11:24 AM  BP/Weight  Systolic BP 112 128 112  Diastolic BP 70 76 74  Wt. (Lbs) 171 173 169  BMI 30.78 kg/m2 30.65 kg/m2 29.94 kg/m2    Physical Exam Vitals reviewed.  Constitutional:      Appearance: Normal appearance.  Cardiovascular:     Rate and Rhythm: Normal rate and regular rhythm.     Heart sounds: Normal heart sounds.  Pulmonary:     Effort: Pulmonary effort is normal.     Breath sounds: Normal breath sounds.  Abdominal:     General: Bowel sounds are normal.     Palpations: Abdomen is soft.     Tenderness: There is no abdominal tenderness.  Neurological:     Mental Status: She is alert and oriented to person, place, and time.  Psychiatric:        Mood and Affect: Mood normal.        Behavior: Behavior normal.        Thought Content: Thought content normal.     Diabetic Foot Exam - Simple   No data filed      Lab Results  Component Value Date   WBC 9.0 03/31/2022   HGB 11.7 03/31/2022   HCT 35.8 03/31/2022   PLT 422 03/31/2022   GLUCOSE 123 (H) 03/31/2022   ALT 17 03/31/2022   AST 14 03/31/2022   NA 139 03/31/2022   K 4.5 03/31/2022   CL 103 03/31/2022   CREATININE 0.82 03/31/2022   BUN 12 03/31/2022   CO2 23 03/31/2022   TSH 0.832 03/31/2022      Assessment & Plan:    Depression, major, single episode, moderate (HCC) Assessment & Plan: Controlled The current medical regimen is effective;  continue present plan and medications Lexapro 10mg  Will  inform us if she continues to have multiple HA as this is a side effect.  Will adjust medication as needed depending on side effects.  Orders: -     Escitalopram Oxalate; Take 1 tablet (10 mg total) by mouth daily.  Dispense: 90 tablet; Refill: 1  Need for Tdap vaccination Assessment & Plan: Administered in office No major side effects noted   Orders: -     Tdap vaccine greater than or equal to 7yo IM  Anxiety Assessment & Plan: Controlled Continue current medications with Clonazepam .5mg  as directed Denies any major side effects or issues taking the medication  Orders: -     clonazePAM; Take 1 tablet (0.5 mg total) by mouth as needed for anxiety.  Dispense: 30 tablet; Refill: 1  Gastroesophageal reflux disease, unspecified whether esophagitis present Assessment & Plan: Previously on Dexilant, stated that worked very well but was denied by insurance Resent as her insurance has changed to see if they will cover it  Orders: -     Dexlansoprazole; Take 1 capsule (60 mg total) by mouth daily.  Dispense: 30 capsule; Refill: 1  Hx of migraines Assessment & Plan: Increase in HA could be from stress/sinus congestion/Lexapro Patient will continue to monitor frequency and let us know so we can adjust medications as needed  Orders: -     SUMAtriptan Succinate; SMARTSIG:1 Tablet(s) By Mouth 1-2 Times Daily  Dispense: 10 tablet; Refill: 3  GAD (generalized anxiety disorder) Assessment & Plan: Controlled Denies any major side effects or issues taking the medication Continue taking medication as directed  Orders: -     Escitalopram Oxalate; Take 1 tablet (10  mg total) by mouth daily.  Dispense: 90 tablet; Refill: 1  Menorrhagia with irregular cycle Assessment & Plan: Patient discontinued birth control due to side effects Will follow up with her OBGYN if she would like to start a new one Will also set up PAP smear with OBGYN   Orders: -     CBC with Differential/Platelet;  Future -     Comprehensive metabolic panel; Future -     Lipid panel; Future  Family history of diabetes mellitus (DM) Assessment & Plan: Labs drawn at a future date due to patient eating about 1 hour ago Continues working on diet and exercise Will add medication as needed depending on labs  Orders: -     Hemoglobin A1c; Future     Meds ordered this encounter  Medications   clonazePAM (KLONOPIN) 0.5 MG tablet    Sig: Take 1 tablet (0.5 mg total) by mouth as needed for anxiety.    Dispense:  30 tablet    Refill:  1   SUMAtriptan (IMITREX) 25 MG tablet    Sig: SMARTSIG:1 Tablet(s) By Mouth 1-2 Times Daily    Dispense:  10 tablet    Refill:  3   dexlansoprazole (DEXILANT) 60 MG capsule    Sig: Take 1 capsule (60 mg total) by mouth daily.    Dispense:  30 capsule    Refill:  1   escitalopram (LEXAPRO) 10 MG tablet    Sig: Take 1 tablet (10 mg total) by mouth daily.    Dispense:  90 tablet    Refill:  1    Orders Placed This Encounter  Procedures   Tdap vaccine greater than or equal to 7yo IM   CBC with Differential/Platelet   Comprehensive metabolic panel   Hemoglobin A1c   Lipid panel     Follow-up: Return in about 6 months (around 04/01/2023) for Chronic, fasting, Huston Foley.   I,Jacqua L Marsh,acting as a scribe for [employer], PA.,have documented all relevant documentation on the behalf of Langley Gauss, PA,as directed by  Langley Gauss, PA while in the presence of Langley Gauss, [place].   An After Visit Summary was printed and given to the patient.  Langley Gauss, [place] [facility] [phone]

## 2022-09-30 NOTE — Assessment & Plan Note (Signed)
Previously on Dexilant, stated that worked very well but was denied by Geophysicist/field seismologist as her insurance has changed to see if they will cover it

## 2022-09-30 NOTE — Assessment & Plan Note (Signed)
Patient discontinued birth control due to side effects Will follow up with her OBGYN if she would like to start a new one Will also set up PAP smear with OBGYN

## 2022-10-03 ENCOUNTER — Other Ambulatory Visit: Payer: Self-pay

## 2022-10-03 DIAGNOSIS — K219 Gastro-esophageal reflux disease without esophagitis: Secondary | ICD-10-CM

## 2022-10-04 ENCOUNTER — Other Ambulatory Visit: Payer: 59

## 2022-10-05 ENCOUNTER — Other Ambulatory Visit: Payer: 59

## 2022-10-05 DIAGNOSIS — Z833 Family history of diabetes mellitus: Secondary | ICD-10-CM

## 2022-10-05 DIAGNOSIS — N921 Excessive and frequent menstruation with irregular cycle: Secondary | ICD-10-CM

## 2022-10-06 LAB — CBC WITH DIFFERENTIAL/PLATELET
Basophils Absolute: 0 10*3/uL (ref 0.0–0.2)
Basos: 1 %
EOS (ABSOLUTE): 0.2 10*3/uL (ref 0.0–0.4)
Eos: 2 %
Hematocrit: 39.7 % (ref 34.0–46.6)
Hemoglobin: 12.5 g/dL (ref 11.1–15.9)
Immature Grans (Abs): 0 10*3/uL (ref 0.0–0.1)
Immature Granulocytes: 0 %
Lymphocytes Absolute: 2.6 10*3/uL (ref 0.7–3.1)
Lymphs: 32 %
MCH: 27.3 pg (ref 26.6–33.0)
MCHC: 31.5 g/dL (ref 31.5–35.7)
MCV: 87 fL (ref 79–97)
Monocytes Absolute: 0.7 10*3/uL (ref 0.1–0.9)
Monocytes: 8 %
Neutrophils Absolute: 4.7 10*3/uL (ref 1.4–7.0)
Neutrophils: 57 %
Platelets: 423 10*3/uL (ref 150–450)
RBC: 4.58 x10E6/uL (ref 3.77–5.28)
RDW: 13.1 % (ref 11.7–15.4)
WBC: 8.1 10*3/uL (ref 3.4–10.8)

## 2022-10-06 LAB — COMPREHENSIVE METABOLIC PANEL
ALT: 26 IU/L (ref 0–32)
AST: 21 IU/L (ref 0–40)
Albumin: 4.6 g/dL (ref 3.9–4.9)
Alkaline Phosphatase: 53 IU/L (ref 44–121)
BUN/Creatinine Ratio: 15 (ref 9–23)
BUN: 14 mg/dL (ref 6–24)
Bilirubin Total: 0.3 mg/dL (ref 0.0–1.2)
CO2: 22 mmol/L (ref 20–29)
Calcium: 9.8 mg/dL (ref 8.7–10.2)
Chloride: 104 mmol/L (ref 96–106)
Creatinine, Ser: 0.96 mg/dL (ref 0.57–1.00)
Globulin, Total: 2.1 g/dL (ref 1.5–4.5)
Glucose: 98 mg/dL (ref 70–99)
Potassium: 5.2 mmol/L (ref 3.5–5.2)
Sodium: 138 mmol/L (ref 134–144)
Total Protein: 6.7 g/dL (ref 6.0–8.5)
eGFR: 75 mL/min/{1.73_m2} (ref >59)

## 2022-10-06 LAB — LIPID PANEL
Chol/HDL Ratio: 3.8 ratio (ref 0.0–4.4)
Cholesterol, Total: 152 mg/dL (ref 100–199)
HDL: 40 mg/dL (ref >39)
LDL Chol Calc (NIH): 82 mg/dL (ref 0–99)
Triglycerides: 178 mg/dL — ABNORMAL HIGH (ref 0–149)
VLDL Cholesterol Cal: 30 mg/dL (ref 5–40)

## 2022-10-06 LAB — HEMOGLOBIN A1C
Est. average glucose Bld gHb Est-mCnc: 140 mg/dL
Hgb A1c MFr Bld: 6.5 % — ABNORMAL HIGH (ref 4.8–5.6)

## 2023-01-30 ENCOUNTER — Other Ambulatory Visit: Payer: Self-pay | Admitting: Physician Assistant

## 2023-01-30 DIAGNOSIS — Z8669 Personal history of other diseases of the nervous system and sense organs: Secondary | ICD-10-CM

## 2023-02-14 LAB — HM MAMMOGRAPHY

## 2023-02-21 ENCOUNTER — Encounter: Payer: Self-pay | Admitting: Physician Assistant

## 2023-03-28 ENCOUNTER — Other Ambulatory Visit: Payer: Self-pay

## 2023-03-28 DIAGNOSIS — E559 Vitamin D deficiency, unspecified: Secondary | ICD-10-CM

## 2023-03-28 DIAGNOSIS — Z833 Family history of diabetes mellitus: Secondary | ICD-10-CM

## 2023-03-28 DIAGNOSIS — K219 Gastro-esophageal reflux disease without esophagitis: Secondary | ICD-10-CM

## 2023-03-28 DIAGNOSIS — E781 Pure hyperglyceridemia: Secondary | ICD-10-CM

## 2023-03-29 ENCOUNTER — Other Ambulatory Visit: Payer: 59

## 2023-03-29 DIAGNOSIS — E559 Vitamin D deficiency, unspecified: Secondary | ICD-10-CM

## 2023-03-29 DIAGNOSIS — K219 Gastro-esophageal reflux disease without esophagitis: Secondary | ICD-10-CM

## 2023-03-29 DIAGNOSIS — E781 Pure hyperglyceridemia: Secondary | ICD-10-CM

## 2023-03-29 DIAGNOSIS — Z833 Family history of diabetes mellitus: Secondary | ICD-10-CM

## 2023-03-30 LAB — CBC WITH DIFFERENTIAL/PLATELET
Basophils Absolute: 0 10*3/uL (ref 0.0–0.2)
Basos: 0 %
EOS (ABSOLUTE): 0.1 10*3/uL (ref 0.0–0.4)
Eos: 2 %
Hematocrit: 36.2 % (ref 34.0–46.6)
Hemoglobin: 11 g/dL — ABNORMAL LOW (ref 11.1–15.9)
Immature Grans (Abs): 0 10*3/uL (ref 0.0–0.1)
Immature Granulocytes: 0 %
Lymphocytes Absolute: 1.9 10*3/uL (ref 0.7–3.1)
Lymphs: 27 %
MCH: 26.1 pg — ABNORMAL LOW (ref 26.6–33.0)
MCHC: 30.4 g/dL — ABNORMAL LOW (ref 31.5–35.7)
MCV: 86 fL (ref 79–97)
Monocytes Absolute: 0.8 10*3/uL (ref 0.1–0.9)
Monocytes: 11 %
Neutrophils Absolute: 4 10*3/uL (ref 1.4–7.0)
Neutrophils: 60 %
Platelets: 444 10*3/uL (ref 150–450)
RBC: 4.21 x10E6/uL (ref 3.77–5.28)
RDW: 14.1 % (ref 11.7–15.4)
WBC: 6.8 10*3/uL (ref 3.4–10.8)

## 2023-03-30 LAB — VITAMIN D 25 HYDROXY (VIT D DEFICIENCY, FRACTURES): Vit D, 25-Hydroxy: 58.7 ng/mL (ref 30.0–100.0)

## 2023-03-30 LAB — COMPREHENSIVE METABOLIC PANEL WITH GFR
ALT: 13 IU/L (ref 0–32)
AST: 13 IU/L (ref 0–40)
Albumin: 4.3 g/dL (ref 3.9–4.9)
Alkaline Phosphatase: 53 IU/L (ref 44–121)
BUN/Creatinine Ratio: 12 (ref 9–23)
BUN: 11 mg/dL (ref 6–24)
Bilirubin Total: 0.2 mg/dL (ref 0.0–1.2)
CO2: 21 mmol/L (ref 20–29)
Calcium: 9.7 mg/dL (ref 8.7–10.2)
Chloride: 103 mmol/L (ref 96–106)
Creatinine, Ser: 0.92 mg/dL (ref 0.57–1.00)
Globulin, Total: 2.3 g/dL (ref 1.5–4.5)
Glucose: 97 mg/dL (ref 70–99)
Potassium: 5.1 mmol/L (ref 3.5–5.2)
Sodium: 141 mmol/L (ref 134–144)
Total Protein: 6.6 g/dL (ref 6.0–8.5)
eGFR: 79 mL/min/1.73

## 2023-03-30 LAB — LIPID PANEL
Chol/HDL Ratio: 3.8 ratio (ref 0.0–4.4)
Cholesterol, Total: 149 mg/dL (ref 100–199)
HDL: 39 mg/dL — ABNORMAL LOW
LDL Chol Calc (NIH): 82 mg/dL (ref 0–99)
Triglycerides: 163 mg/dL — ABNORMAL HIGH (ref 0–149)
VLDL Cholesterol Cal: 28 mg/dL (ref 5–40)

## 2023-03-30 LAB — HEMOGLOBIN A1C
Est. average glucose Bld gHb Est-mCnc: 131 mg/dL
Hgb A1c MFr Bld: 6.2 % — ABNORMAL HIGH (ref 4.8–5.6)

## 2023-03-30 NOTE — Progress Notes (Signed)
Subjective:  Patient ID: Felicia Tucker, female    DOB: 1980-12-10  Age: 42 y.o. MRN: [id number]  Chief Complaint  Patient presents with   Medical Management of Chronic Issues    HPI   Pt presents for follow-up of depression with anxiety, GERD.   Anxiety and depression are currently very well controlled.   Patient recently started Zepbound to help with her weight loss. She gets it through a private company that specializes in [employer].  Discussed the use of AI scribe software for clinical note transcription with the patient, who gave verbal consent to proceed.  History of Present Illness   Felicia Tucker, a 42 year old with a history of anemia, migraines, anxiety, and depression, presents for a routine follow-up visit. She reports no new complaints and is generally doing well. Her primary concern is her low blood count, which she noticed in her recent lab results. She has a history of anemia during pregnancy and recalls eating dirt as a child, a behavior often associated with iron deficiency.  She has been managing her weight and blood sugar levels with Zepbound, which she started in May. Since then, she has lost about 30 pounds and her A1c has decreased from 6.5 to 6.2. She experiences mild side effects from the medication, including occasional nausea and headaches a few days after taking it.  She also reports taking clonazepam for public speaking anxiety and Lexapro for depression, both of which she finds effective. She uses Imitrex for migraines, which she experiences occasionally, often a few days after taking Zepbound.  She also mentions a recent bruise from a blood draw, which she found unusual as she typically does not bruise from such procedures. However, she reports no pain or warmth at the site of the bruise.          03/31/2023    9:56 AM 09/30/2022   10:39 AM 01/04/2022    1:58 PM  Depression screen PHQ 2/9  Decreased Interest 0 0 2  Down, Depressed, Hopeless 0  0 2  PHQ - 2 Score 0 0 4  Altered sleeping 3 0 0  Tired, decreased energy 1 0 2  Change in appetite 0 0 1  Feeling bad or failure about yourself  0 0 3  Trouble concentrating 0 0 3  Moving slowly or fidgety/restless 0 0 0  Suicidal thoughts 0 0 0  PHQ-9 Score 4 0 13  Difficult doing work/chores Not difficult at all Not difficult at all Somewhat difficult        03/31/2023    9:59 AM  Fall Risk   Falls in the past year? 0  Number falls in past yr: 0  Injury with Fall? 0  Risk for fall due to : No Fall Risks  Follow up Falls evaluation completed    Patient Care Team: Felicia Tucker, [place] as PCP - General (Physician Assistant) Cox, Kirsten, MD (Family Medicine)   Review of Systems  Constitutional:  Negative for appetite change, fatigue and fever.  HENT:  Negative for congestion, ear pain, sinus pressure and sore throat.   Respiratory:  Negative for cough, chest tightness, shortness of breath and wheezing.   Cardiovascular:  Negative for chest pain and palpitations.  Gastrointestinal:  Negative for abdominal pain, constipation, diarrhea, nausea and vomiting.  Genitourinary:  Negative for dysuria and hematuria.  Musculoskeletal:  Negative for arthralgias, back pain, joint swelling and myalgias.  Skin:  Negative for rash.  Neurological:  Negative for dizziness, weakness and headaches.  Psychiatric/Behavioral:  Negative for dysphoric mood. The patient is not nervous/anxious.     Current Outpatient Medications on File Prior to Visit  Medication Sig Dispense Refill   omeprazole-sodium bicarbonate (ZEGERID) 40-1100 MG capsule Take 1 capsule by mouth daily before breakfast.     SUMAtriptan (IMITREX) 25 MG tablet SMARTSIG:1 TABLET(S) BY MOUTH 1-2 TIMES DAILY 10 tablet 3   No current facility-administered medications on file prior to visit.   Past Medical History:  Diagnosis Date   Anxiety    GERD (gastroesophageal reflux disease)    Gestational diabetes    History of kidney stones     PONV (postoperative nausea and vomiting)    Renal disorder    Kidney Stones   Past Surgical History:  Procedure Laterality Date   CYSTOSCOPY/URETEROSCOPY/HOLMIUM LASER/STENT PLACEMENT Left 07/26/2021   Procedure: CYSTOSCOPY/URETEROSCOPY/HOLMIUM LASER/STENT PLACEMENT;  Surgeon: Bjorn Pippin, MD;  Location: [facility];  Service: Urology;  Laterality: Left;   DILATION AND CURETTAGE OF UTERUS     DILATION AND CURETTAGE OF UTERUS  2010   EXTRACORPOREAL SHOCK WAVE LITHOTRIPSY Left 02/16/2017   Procedure: LEFT EXTRACORPOREAL SHOCK WAVE LITHOTRIPSY (ESWL);  Surgeon: Debroah Baller, MD;  Location: [facility];  Service: Urology;  Laterality: Left;   LITHOTRIPSY     Multiple lithotripsy   LITHOTRIPSY     multiple- x10   WISDOM TOOTH EXTRACTION      Family History  Problem Relation Age of Onset   Ovarian cancer Mother    Diabetes Mother    Heart disease Father    Breast cancer Other    Colon cancer Neg Hx    Rectal cancer Neg Hx    Stomach cancer Neg Hx    Social History   Socioeconomic History   Marital status: Married    Spouse name: Felicia Tucker   Number of children: 2   Years of education: Not on file   Highest education level: Not on file  Occupational History   Occupation: Self Employed  Tobacco Use   Smoking status: Former    Types: Cigarettes   Smokeless tobacco: Never  Vaping Use   Vaping status: Never Used  Substance and Sexual Activity   Alcohol use: Yes    Comment: Socially   Drug use: No   Sexual activity: Yes    Partners: Male  Other Topics Concern   Not on file  Social History Narrative   ** Merged History Encounter **       Social Drivers of Health   Financial Resource Strain: Low Risk  (01/04/2022)   Overall Financial Resource Strain (CARDIA)    Difficulty of Paying Living Expenses: Not hard at all  Food Insecurity: No Food Insecurity (01/04/2022)   Hunger Vital Sign    Worried About Running Out of Food in the Last Year: Never true    Ran Out of Food in  the Last Year: Never true  Transportation Needs: No Transportation Needs (01/04/2022)   PRAPARE - Administrator, Civil Service (Medical): No    Lack of Transportation (Non-Medical): No  Physical Activity: Sufficiently Active (01/04/2022)   Exercise Vital Sign    Days of Exercise per Week: 3 days    Minutes of Exercise per Session: 50 min  Stress: No Stress Concern Present (01/04/2022)   [employer] of Occupational Health - Occupational Stress Questionnaire    Feeling of Stress : Not at all  Social Connections: Moderately Integrated (01/04/2022)   Social Connection and Isolation Panel [NHANES]  Frequency of Communication with Friends and Family: More than three times a week    Frequency of Social Gatherings with Friends and Family: More than three times a week    Attends Religious Services: More than 4 times per year    Active Member of [employer] or Organizations: No    Attends Engineer, structural: Never    Marital Status: Married    Objective:  BP 132/82 (BP Location: Left Arm, Patient Position: Sitting)   Pulse 88   Temp 97.6 F (36.4 C) (Temporal)   Ht 5' 2.5" (1.588 m)   Wt 152 lb (68.9 kg)   SpO2 99%   BMI 27.36 kg/m      03/31/2023   10:00 AM 09/30/2022   10:37 AM 03/31/2022    1:33 PM  BP/Weight  Systolic BP 132 112 128  Diastolic BP 82 70 76  Wt. (Lbs) 152 171 173  BMI 27.36 kg/m2 30.78 kg/m2 30.65 kg/m2    Physical Exam Vitals reviewed.  Constitutional:      Appearance: Normal appearance.  Cardiovascular:     Rate and Rhythm: Normal rate and regular rhythm.     Heart sounds: Normal heart sounds.  Pulmonary:     Effort: Pulmonary effort is normal.     Breath sounds: Normal breath sounds.  Abdominal:     General: Bowel sounds are normal.     Palpations: Abdomen is soft.     Tenderness: There is no abdominal tenderness.  Neurological:     Mental Status: She is alert and oriented to person, place, and time.  Psychiatric:         Mood and Affect: Mood normal.        Behavior: Behavior normal.     Diabetic Foot Exam - Simple   No data filed      Lab Results  Component Value Date   WBC 6.8 03/29/2023   HGB 11.0 (L) 03/29/2023   HCT 36.2 03/29/2023   PLT 444 03/29/2023   GLUCOSE 97 03/29/2023   CHOL 149 03/29/2023   TRIG 163 (H) 03/29/2023   HDL 39 (L) 03/29/2023   LDLCALC 82 03/29/2023   ALT 13 03/29/2023   AST 13 03/29/2023   NA 141 03/29/2023   K 5.1 03/29/2023   CL 103 03/29/2023   CREATININE 0.92 03/29/2023   BUN 11 03/29/2023   CO2 21 03/29/2023   TSH 0.832 03/31/2022   HGBA1C 6.2 (H) 03/29/2023      Assessment & Plan:    Type 2 diabetes mellitus with hyperglycemia, without long-term current use of insulin (HCC) Assessment & Plan: A1c improved from 6.5 to 6.2 with use of Zepbound and lifestyle modifications including weight loss and improved diet. -Continue Zepbound 7.5mg  weekly.  Orders: -     Zepbound; Inject 7.5 mg into the skin once a week.  Dispense: 2 mL; Refill: 2  Hypertriglyceridemia Assessment & Plan: Controlled Continue taking zepbound 7.5mg  as prescribed Continue working on diet and exercise   Gastroesophageal reflux disease without esophagitis Assessment & Plan: Uncontrolled Continue to monitor diet and exercise Will try Dexilant again if her insurance is willing to cover it.    Anxiety Assessment & Plan: Well controlled on Lexapro and occasional use of Clonazepam for public speaking. -Continue Lexapro and Clonazepam as needed.  Orders: -     clonazePAM; Take 1 tablet (0.5 mg total) by mouth as needed for anxiety.  Dispense: 30 tablet; Refill: 1  Iron deficiency anemia secondary to inadequate dietary  iron intake Assessment & Plan: Labs drawn today Given samples of Accufer Will redraw labs again in 3 months to see if they stay low   Orders: -     Iron, TIBC and Ferritin Panel  GAD (generalized anxiety disorder) Assessment & Plan: Well controlled on  Lexapro and occasional use of Clonazepam for public speaking. -Continue Lexapro and Clonazepam as needed.  Orders: -     Escitalopram Oxalate; Take 1 tablet (10 mg total) by mouth daily.  Dispense: 90 tablet; Refill: 3  Depression, major, single episode, moderate (HCC) Assessment & Plan: Well controlled on Lexapro and occasional use of Clonazepam for public speaking. -Continue Lexapro and Clonazepam as needed.  Orders: -     Escitalopram Oxalate; Take 1 tablet (10 mg total) by mouth daily.  Dispense: 90 tablet; Refill: 3  Hx of migraines Assessment & Plan: Occasional migraines, well controlled with Imitrex. -Continue Imitrex as needed.      Meds ordered this encounter  Medications   tirzepatide (ZEPBOUND) 7.5 MG/0.5ML Pen    Sig: Inject 7.5 mg into the skin once a week.    Dispense:  2 mL    Refill:  2   escitalopram (LEXAPRO) 10 MG tablet    Sig: Take 1 tablet (10 mg total) by mouth daily.    Dispense:  90 tablet    Refill:  3   clonazePAM (KLONOPIN) 0.5 MG tablet    Sig: Take 1 tablet (0.5 mg total) by mouth as needed for anxiety.    Dispense:  30 tablet    Refill:  1    Orders Placed This Encounter  Procedures   Iron, TIBC and Ferritin Panel    Assessment and Plan       Follow-up: Return in about 6 months (around 09/29/2023) for Chronic, Huston Foley.   I,Marla I Leal-Borjas,acting as a scribe for [employer], PA.,have documented all relevant documentation on the behalf of Felicia Gauss, PA,as directed by  Felicia Gauss, PA while in the presence of Felicia Tucker, [place].   An After Visit Summary was printed and given to the patient.  Felicia Tucker, [place] [facility] [phone]

## 2023-03-31 ENCOUNTER — Other Ambulatory Visit: Payer: Self-pay | Admitting: Physician Assistant

## 2023-03-31 ENCOUNTER — Ambulatory Visit (INDEPENDENT_AMBULATORY_CARE_PROVIDER_SITE_OTHER): Payer: 59 | Admitting: Physician Assistant

## 2023-03-31 ENCOUNTER — Encounter: Payer: Self-pay | Admitting: Physician Assistant

## 2023-03-31 VITALS — BP 132/82 | HR 88 | Temp 97.6°F | Ht 62.5 in | Wt 152.0 lb

## 2023-03-31 DIAGNOSIS — F419 Anxiety disorder, unspecified: Secondary | ICD-10-CM

## 2023-03-31 DIAGNOSIS — D508 Other iron deficiency anemias: Secondary | ICD-10-CM

## 2023-03-31 DIAGNOSIS — E781 Pure hyperglyceridemia: Secondary | ICD-10-CM | POA: Diagnosis not present

## 2023-03-31 DIAGNOSIS — K219 Gastro-esophageal reflux disease without esophagitis: Secondary | ICD-10-CM

## 2023-03-31 DIAGNOSIS — F411 Generalized anxiety disorder: Secondary | ICD-10-CM

## 2023-03-31 DIAGNOSIS — E1165 Type 2 diabetes mellitus with hyperglycemia: Secondary | ICD-10-CM

## 2023-03-31 DIAGNOSIS — Z8669 Personal history of other diseases of the nervous system and sense organs: Secondary | ICD-10-CM

## 2023-03-31 DIAGNOSIS — Z833 Family history of diabetes mellitus: Secondary | ICD-10-CM

## 2023-03-31 DIAGNOSIS — F321 Major depressive disorder, single episode, moderate: Secondary | ICD-10-CM

## 2023-03-31 MED ORDER — ESCITALOPRAM OXALATE 10 MG PO TABS
10.0000 mg | ORAL_TABLET | Freq: Every day | ORAL | 3 refills | Status: AC
Start: 2023-03-31 — End: ?

## 2023-03-31 MED ORDER — TIRZEPATIDE 5 MG/0.5ML ~~LOC~~ SOAJ: 5.0000 mg | SUBCUTANEOUS | 0 refills | Status: AC

## 2023-03-31 MED ORDER — CLONAZEPAM 0.5 MG PO TABS: 0.5000 mg | ORAL_TABLET | ORAL | 1 refills | Status: AC | PRN

## 2023-03-31 MED ORDER — ZEPBOUND 7.5 MG/0.5ML ~~LOC~~ SOAJ: 7.5000 mg | SUBCUTANEOUS | 2 refills | Status: DC

## 2023-03-31 NOTE — Assessment & Plan Note (Signed)
Well controlled on Lexapro and occasional use of Clonazepam for public speaking. -Continue Lexapro and Clonazepam as needed.

## 2023-03-31 NOTE — Assessment & Plan Note (Signed)
Controlled Continue taking zepbound 7.5mg  as prescribed Continue working on diet and exercise

## 2023-03-31 NOTE — Assessment & Plan Note (Signed)
Occasional migraines, well controlled with Imitrex. -Continue Imitrex as needed.

## 2023-03-31 NOTE — Assessment & Plan Note (Signed)
A1c improved from 6.5 to 6.2 with use of Zepbound and lifestyle modifications including weight loss and improved diet. -Continue Zepbound 7.5mg  weekly.

## 2023-03-31 NOTE — Assessment & Plan Note (Signed)
Uncontrolled Continue to monitor diet and exercise Will try Dexilant again if her insurance is willing to cover it.

## 2023-03-31 NOTE — Assessment & Plan Note (Signed)
Labs drawn today Given samples of Accufer Will redraw labs again in 3 months to see if they stay low

## 2023-04-01 ENCOUNTER — Other Ambulatory Visit: Payer: Self-pay | Admitting: Physician Assistant

## 2023-04-01 DIAGNOSIS — D508 Other iron deficiency anemias: Secondary | ICD-10-CM

## 2023-04-01 LAB — IRON,TIBC AND FERRITIN PANEL
Ferritin: 6 ng/mL — ABNORMAL LOW (ref 15–150)
Iron Saturation: 4 % — CL (ref 15–55)
Iron: 18 ug/dL — ABNORMAL LOW (ref 27–159)
Total Iron Binding Capacity: 459 ug/dL — ABNORMAL HIGH (ref 250–450)
UIBC: 441 ug/dL — ABNORMAL HIGH (ref 131–425)

## 2023-04-01 MED ORDER — ACCRUFER 30 MG PO CAPS: 30.0000 mg | ORAL_CAPSULE | Freq: Two times a day (BID) | ORAL | 2 refills | Status: DC

## 2023-04-03 ENCOUNTER — Other Ambulatory Visit: Payer: Self-pay

## 2023-04-03 DIAGNOSIS — D508 Other iron deficiency anemias: Secondary | ICD-10-CM

## 2023-04-23 ENCOUNTER — Other Ambulatory Visit: Payer: Self-pay | Admitting: Physician Assistant

## 2023-04-23 DIAGNOSIS — D508 Other iron deficiency anemias: Secondary | ICD-10-CM

## 2023-04-24 ENCOUNTER — Other Ambulatory Visit: Payer: Self-pay | Admitting: Physician Assistant

## 2023-04-24 DIAGNOSIS — D508 Other iron deficiency anemias: Secondary | ICD-10-CM

## 2023-04-24 MED ORDER — IRON (FERROUS SULFATE) 325 (65 FE) MG PO TABS: 1.0000 | ORAL_TABLET | Freq: Every day | ORAL | 3 refills | Status: DC

## 2023-05-10 ENCOUNTER — Telehealth: Payer: Self-pay

## 2023-05-10 NOTE — Telephone Encounter (Signed)
Patient has been approved for Dexlansonprazole 60mg  dr capsules till 05/08/2024.

## 2023-05-14 IMAGING — CT CT RENAL STONE PROTOCOL
2 of 4 series · 16 of 46 positions shown, 18 images · non-contrast
Comparison: 07/20/2021

CLINICAL DATA: Flank pain, kidney stone suspected



[Series 2: axial st · axial · 0.89mm/px · z∈[-784,-389]mm · 13 of 91 slices shown, 15 images]
[im 6/91  soft-tissue]
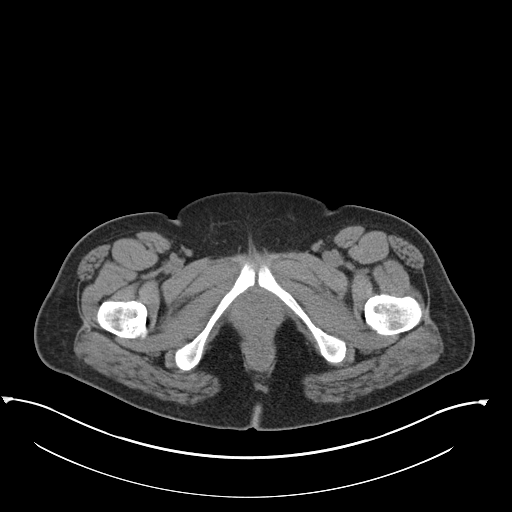
[im 6/91  bone]
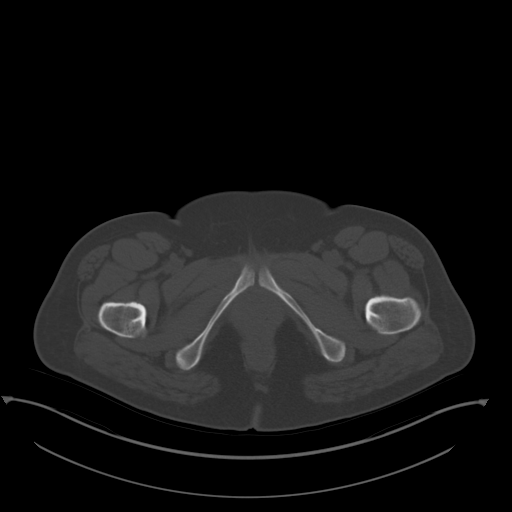
[im 11/91  soft-tissue]
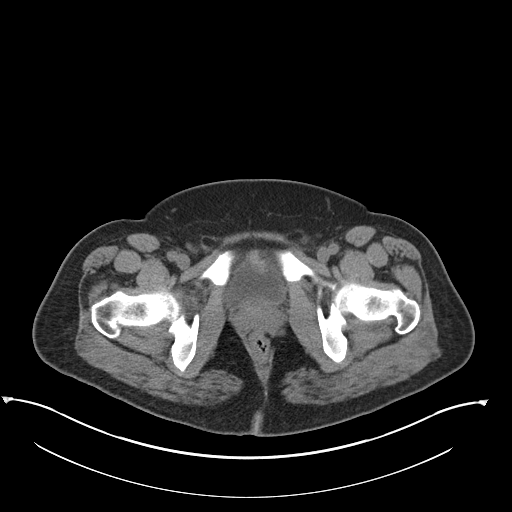
[im 22/91  soft-tissue]
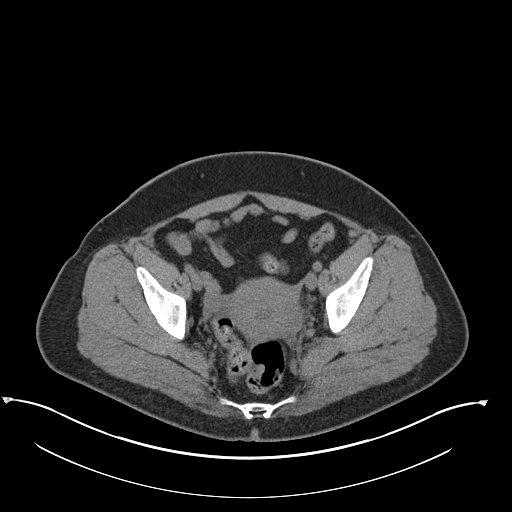
[im 27/91  soft-tissue]
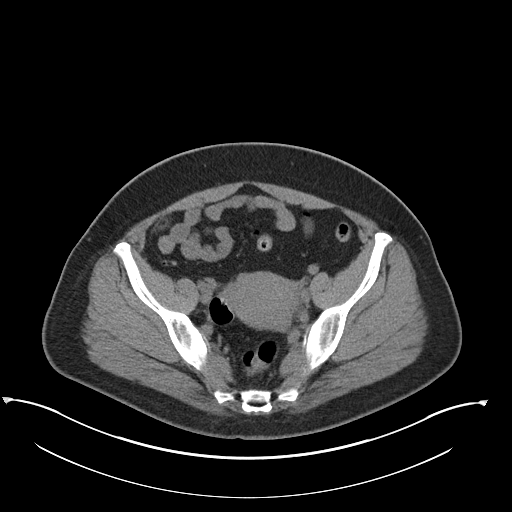
[im 32/91  soft-tissue]
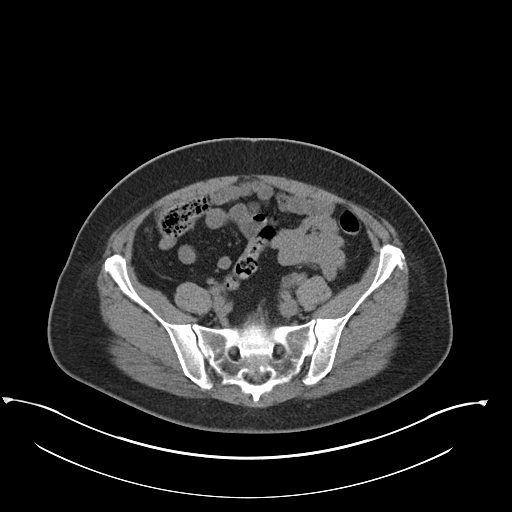
[im 38/91  soft-tissue]
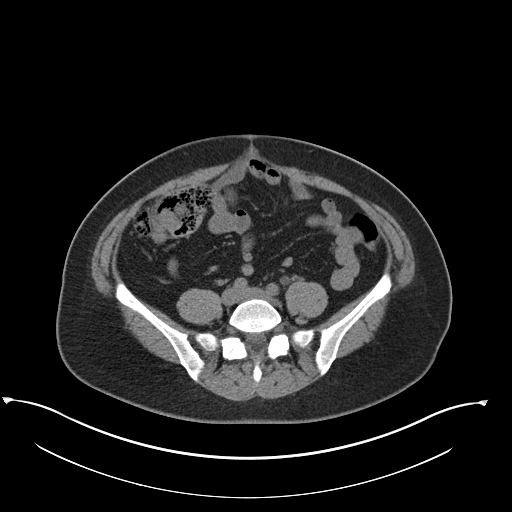
[im 48/91  soft-tissue]
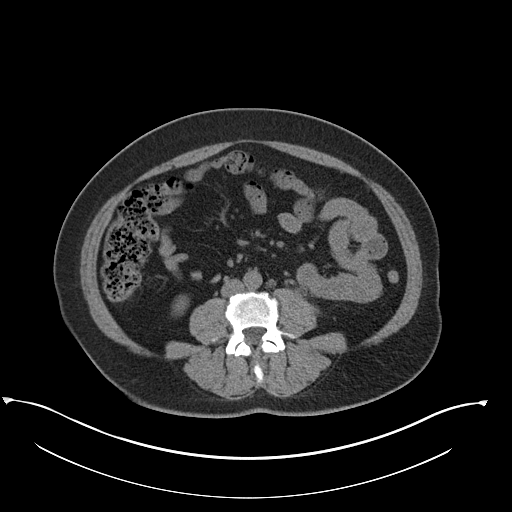
[im 53/91  soft-tissue]
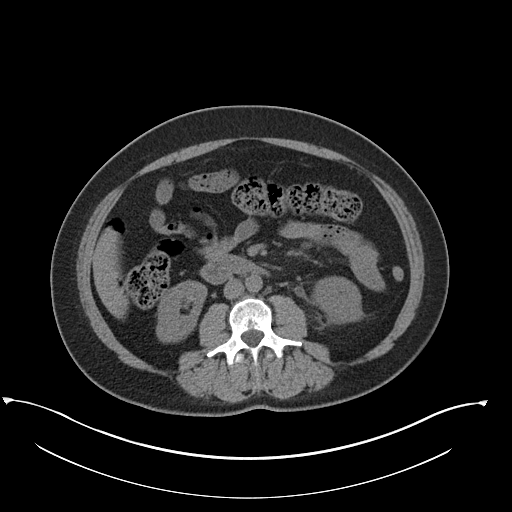
[im 59/91  soft-tissue]
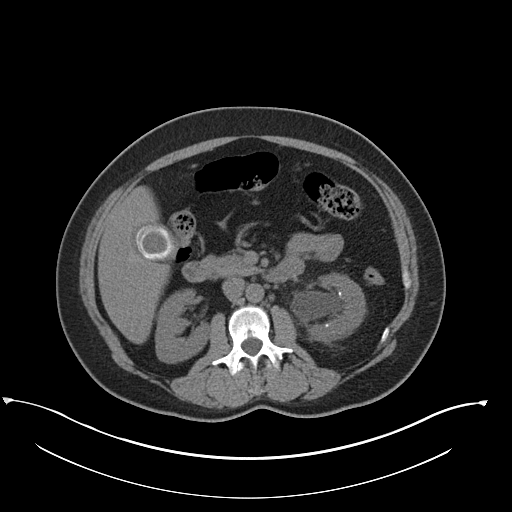
[im 59/91  bone]
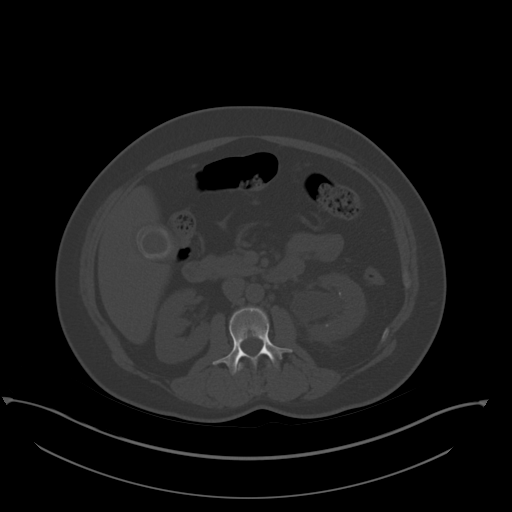
[im 64/91  soft-tissue]
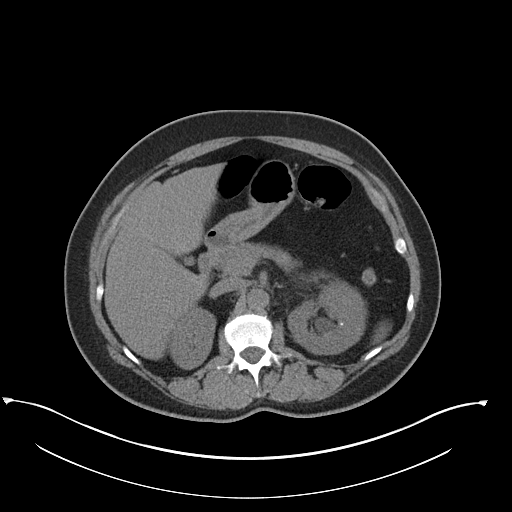
[im 69/91  soft-tissue]
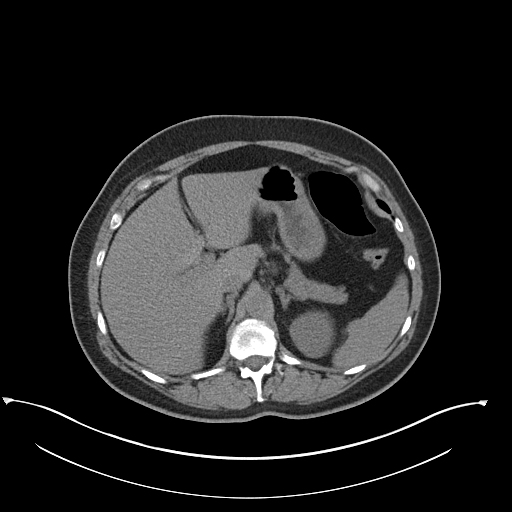
[im 80/91  soft-tissue]
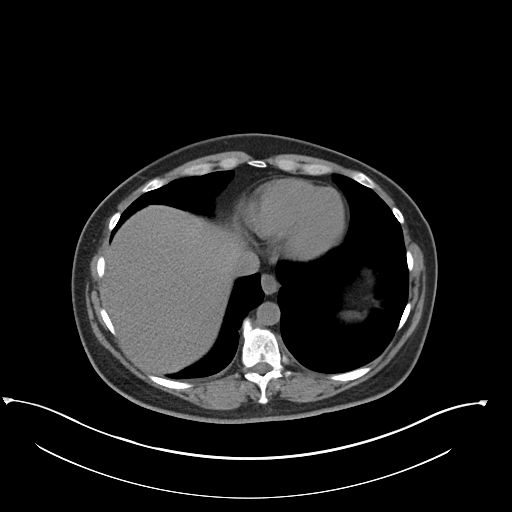
[im 85/91  soft-tissue]
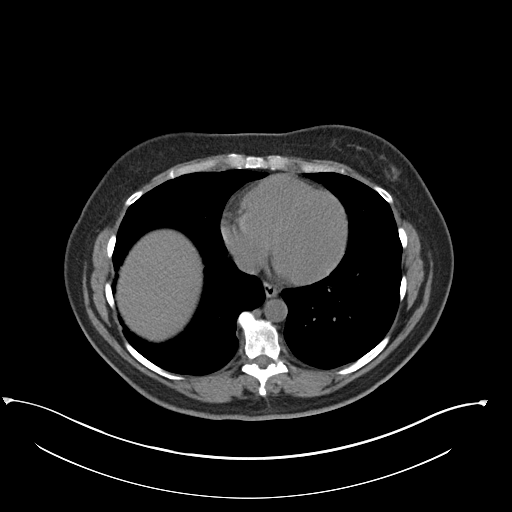

[Series 5: coronal · coronal · 0.81mm/px · 3 of 165 slices shown]
[im 55/165  soft-tissue]
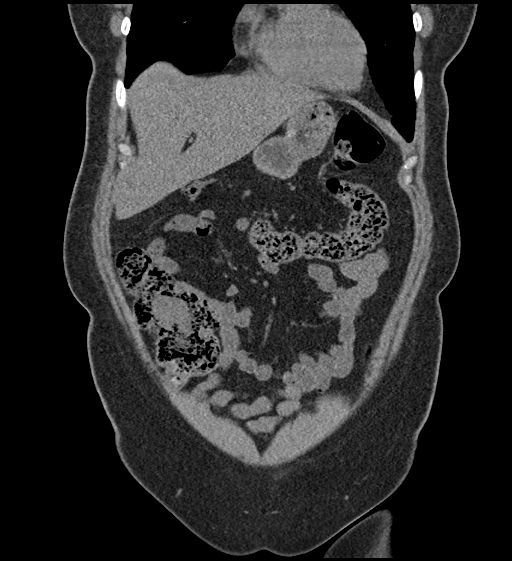
[im 73/165  soft-tissue]
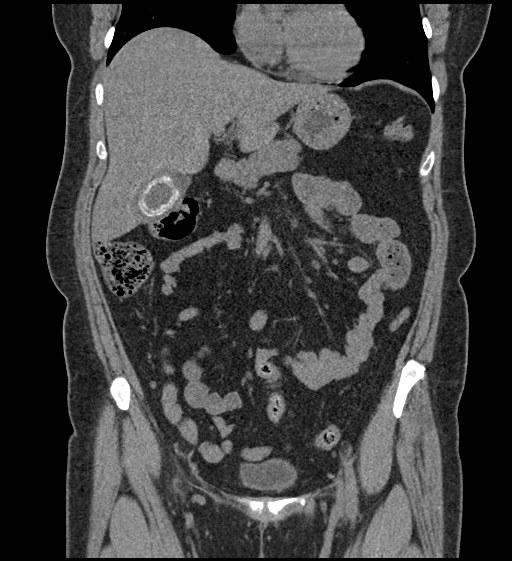
[im 92/165  soft-tissue]
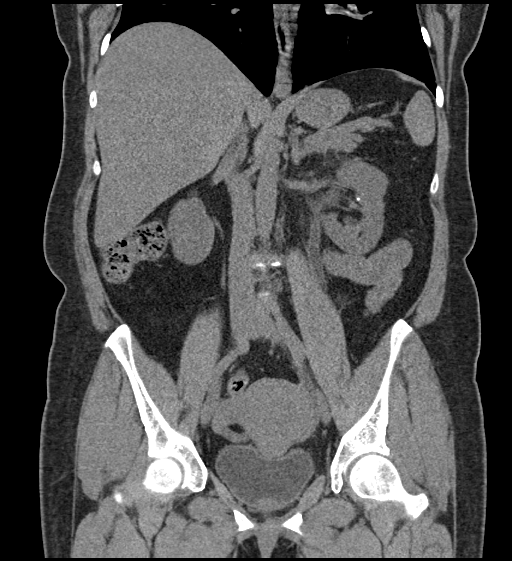

[16 of 46 positions shown; findings below may reference images not displayed]

FINDINGS: Lower chest: No acute abnormality.

Hepatobiliary: Focal liver abnormality. Large calcified gallstone is
again noted. No biliary dilatation.

Pancreas: Unremarkable.

Spleen: Unremarkable.

Adrenals/Urinary Tract: Adrenals are unremarkable. Small
nonobstructing bilateral renal calculi. There is left hydronephrosis
and hydroureter with surrounding fat infiltration. The proximal left
ureteral calculus is no longer present. There are adjacent small
stones or stone fragments at the uterovesical junction with the
largest measuring 2 mm. Bladder is unremarkable.

Stomach/Bowel: Stomach is within normal limits. Bowel is normal in
caliber. Normal appendix.

Vascular/Lymphatic: No significant vascular abnormality on this
noncontrast study. No enlarged nodes.

Reproductive: Uterus and bilateral adnexa are unremarkable.

Other: No acute abnormality of the abdominal wall.

Musculoskeletal: No acute osseous abnormality.
IMPRESSION: Left hydroureteronephrosis. Small stones or stone fragments are
present at the left ureterovesical junction measuring up to 2 mm.

Bilateral nonobstructing renal calculi.

Cholelithiasis.

## 2023-06-05 ENCOUNTER — Other Ambulatory Visit: Payer: Self-pay | Admitting: Physician Assistant

## 2023-06-05 DIAGNOSIS — Z8669 Personal history of other diseases of the nervous system and sense organs: Secondary | ICD-10-CM

## 2023-08-27 ENCOUNTER — Other Ambulatory Visit: Payer: Self-pay | Admitting: Physician Assistant

## 2023-08-27 DIAGNOSIS — D508 Other iron deficiency anemias: Secondary | ICD-10-CM

## 2023-09-03 ENCOUNTER — Other Ambulatory Visit: Payer: Self-pay | Admitting: Physician Assistant

## 2023-09-03 DIAGNOSIS — Z8669 Personal history of other diseases of the nervous system and sense organs: Secondary | ICD-10-CM

## 2023-09-29 ENCOUNTER — Ambulatory Visit: Payer: 59 | Admitting: Physician Assistant

## 2023-10-22 ENCOUNTER — Other Ambulatory Visit: Payer: Self-pay | Admitting: Physician Assistant

## 2023-10-22 DIAGNOSIS — D508 Other iron deficiency anemias: Secondary | ICD-10-CM

## 2024-05-02 ENCOUNTER — Telehealth: Payer: Self-pay

## 2024-05-02 ENCOUNTER — Other Ambulatory Visit (HOSPITAL_COMMUNITY): Payer: Self-pay

## 2024-05-02 NOTE — Telephone Encounter (Signed)
 Pharmacy Patient Advocate Encounter   Received notification from [facility] that prior authorization for Mounjaro  is required/requested.   Insurance verification completed.   The patient is insured through [employer].   Per test claim: The current 28 day co-pay is, $30.  No PA needed at this time. This test claim was processed through [facility]- copay amounts may vary at other pharmacies due to pharmacy/plan contracts, or as the patient moves through the different stages of their insurance plan.
